# Patient Record
Sex: Female | Born: 1960 | Race: White | Hispanic: No | Smoking: Current every day smoker
Health system: Southern US, Community
[De-identification: ages and names within clinical notes are randomized; demographics above are authoritative.]

## PROBLEM LIST (undated history)

## (undated) DIAGNOSIS — M5030 Other cervical disc degeneration, unspecified cervical region: Secondary | ICD-10-CM

## (undated) DIAGNOSIS — T7840XA Allergy, unspecified, initial encounter: Secondary | ICD-10-CM

## (undated) DIAGNOSIS — K589 Irritable bowel syndrome without diarrhea: Secondary | ICD-10-CM

## (undated) DIAGNOSIS — K219 Gastro-esophageal reflux disease without esophagitis: Secondary | ICD-10-CM

## (undated) DIAGNOSIS — R197 Diarrhea, unspecified: Secondary | ICD-10-CM

## (undated) DIAGNOSIS — K3189 Other diseases of stomach and duodenum: Secondary | ICD-10-CM

## (undated) DIAGNOSIS — M5136 Other intervertebral disc degeneration, lumbar region: Secondary | ICD-10-CM

## (undated) DIAGNOSIS — M51369 Other intervertebral disc degeneration, lumbar region without mention of lumbar back pain or lower extremity pain: Secondary | ICD-10-CM

## (undated) DIAGNOSIS — L409 Psoriasis, unspecified: Secondary | ICD-10-CM

## (undated) DIAGNOSIS — R569 Unspecified convulsions: Secondary | ICD-10-CM

## (undated) DIAGNOSIS — K2970 Gastritis, unspecified, without bleeding: Secondary | ICD-10-CM

## (undated) HISTORY — DX: Unspecified convulsions: R56.9

## (undated) HISTORY — DX: Diarrhea, unspecified: R19.7

## (undated) HISTORY — DX: Gastro-esophageal reflux disease without esophagitis: K21.9

## (undated) HISTORY — PX: COLONOSCOPY: SHX174

## (undated) HISTORY — PX: TONSILLECTOMY: SUR1361

## (undated) HISTORY — DX: Allergy, unspecified, initial encounter: T78.40XA

## (undated) HISTORY — DX: Psoriasis, unspecified: L40.9

## (undated) HISTORY — DX: Other cervical disc degeneration, unspecified cervical region: M50.30

---

## 2005-05-18 ENCOUNTER — Ambulatory Visit (HOSPITAL_COMMUNITY): Admission: RE | Admit: 2005-05-18 | Discharge: 2005-05-18 | Payer: Self-pay | Admitting: Family Medicine

## 2006-05-11 ENCOUNTER — Ambulatory Visit (HOSPITAL_COMMUNITY): Admission: RE | Admit: 2006-05-11 | Discharge: 2006-05-11 | Payer: Self-pay | Admitting: Family Medicine

## 2008-06-27 ENCOUNTER — Ambulatory Visit (HOSPITAL_COMMUNITY): Admission: RE | Admit: 2008-06-27 | Discharge: 2008-06-27 | Payer: Self-pay | Admitting: Family Medicine

## 2009-09-17 ENCOUNTER — Ambulatory Visit (HOSPITAL_COMMUNITY): Admission: RE | Admit: 2009-09-17 | Discharge: 2009-09-17 | Payer: Self-pay | Admitting: Family Medicine

## 2009-09-17 ENCOUNTER — Encounter: Payer: Self-pay | Admitting: Family Medicine

## 2014-04-16 ENCOUNTER — Other Ambulatory Visit: Payer: Self-pay | Admitting: Family Medicine

## 2015-03-07 ENCOUNTER — Other Ambulatory Visit: Payer: Self-pay | Admitting: Family Medicine

## 2015-03-11 ENCOUNTER — Other Ambulatory Visit: Payer: Self-pay | Admitting: Family Medicine

## 2015-03-27 ENCOUNTER — Other Ambulatory Visit: Payer: Self-pay | Admitting: Family Medicine

## 2015-06-21 ENCOUNTER — Ambulatory Visit (INDEPENDENT_AMBULATORY_CARE_PROVIDER_SITE_OTHER): Payer: BC Managed Care – PPO | Admitting: Nurse Practitioner

## 2015-06-21 ENCOUNTER — Encounter: Payer: Self-pay | Admitting: Nurse Practitioner

## 2015-06-21 VITALS — BP 146/96 | Temp 99.0°F | Ht 64.0 in | Wt 174.4 lb

## 2015-06-21 DIAGNOSIS — K2970 Gastritis, unspecified, without bleeding: Secondary | ICD-10-CM | POA: Diagnosis not present

## 2015-06-21 DIAGNOSIS — K2100 Gastro-esophageal reflux disease with esophagitis, without bleeding: Secondary | ICD-10-CM

## 2015-06-21 MED ORDER — BACLOFEN 10 MG PO TABS: 10.0000 mg | ORAL_TABLET | Freq: Three times a day (TID) | ORAL | Status: DC

## 2015-06-21 MED ORDER — PANTOPRAZOLE SODIUM 40 MG PO TBEC: 40.0000 mg | DELAYED_RELEASE_TABLET | Freq: Every day | ORAL | Status: DC

## 2015-06-21 NOTE — Patient Instructions (Addendum)
Align as directed OR 2 cups of activia yogurt  Food Choices for Gastroesophageal Reflux Disease When you have gastroesophageal reflux disease (GERD), the foods you eat and your eating habits are very important. Choosing the right foods can help ease the discomfort of GERD. WHAT GENERAL GUIDELINES DO I NEED TO FOLLOW?  Choose fruits, vegetables, whole grains, low-fat dairy products, and low-fat meat, fish, and poultry.  Limit fats such as oils, salad dressings, butter, nuts, and avocado.  Keep a food diary to identify foods that cause symptoms.  Avoid foods that cause reflux. These may be different for different people.  Eat frequent small meals instead of three large meals each day.  Eat your meals slowly, in a relaxed setting.  Limit fried foods.  Cook foods using methods other than frying.  Avoid drinking alcohol.  Avoid drinking large amounts of liquids with your meals.  Avoid bending over or lying down until 2-3 hours after eating. WHAT FOODS ARE NOT RECOMMENDED? The following are some foods and drinks that may worsen your symptoms: Vegetables Tomatoes. Tomato juice. Tomato and spaghetti sauce. Chili peppers. Onion and garlic. Horseradish. Fruits Oranges, grapefruit, and lemon (fruit and juice). Meats High-fat meats, fish, and poultry. This includes hot dogs, ribs, ham, sausage, salami, and bacon. Dairy Whole milk and chocolate milk. Sour cream. Cream. Butter. Ice cream. Cream cheese.  Beverages Coffee and tea, with or without caffeine. Carbonated beverages or energy drinks. Condiments Hot sauce. Barbecue sauce.  Sweets/Desserts Chocolate and cocoa. Donuts. Peppermint and spearmint. Fats and Oils High-fat foods, including [place] fries and potato chips. Other Vinegar. Strong spices, such as black pepper, white pepper, red pepper, cayenne, curry powder, cloves, ginger, and chili powder. The items listed above may not be a complete list of foods and beverages to avoid.  Contact your dietitian for more information. Document Released: 11/16/2005 Document Revised: 11/21/2013 Document Reviewed: 09/20/2013 [facility] Patient Information 2015 [place], [place]. This information is not intended to replace advice given to you by your health care provider. Make sure you discuss any questions you have with your health care provider.

## 2015-06-25 ENCOUNTER — Encounter: Payer: Self-pay | Admitting: Nurse Practitioner

## 2015-06-25 DIAGNOSIS — K2100 Gastro-esophageal reflux disease with esophagitis, without bleeding: Secondary | ICD-10-CM | POA: Insufficient documentation

## 2015-06-25 DIAGNOSIS — K2970 Gastritis, unspecified, without bleeding: Secondary | ICD-10-CM | POA: Insufficient documentation

## 2015-06-25 NOTE — Progress Notes (Signed)
Subjective:  Presents for complaints of pain in the upper mid abdominal area with bloating at times. Occasional vomiting. Pain will resolve after this. Some of what patient terms "esophageal spasms". No chest pain. See scanned typewritten note that patient provided today for detailed information. Pain is unassociated with meals or particular foods. Minimal caffeine intake. Smokes half pack per day. Denies alcohol use. Some Advil use. Did not take meloxicam due to sensitivity.  Objective:   BP 146/96 mmHg  Temp(Src) 99 F (37.2 C) (Oral)  Ht 5\' 4"  (1.626 m)  Wt 174 lb 6 oz (79.096 kg)  BMI 29.92 kg/m2 NAD. Alert, oriented. Lungs clear. Heart regular rate rhythm. Thoughts coherent and relevant. Has some very definite ideas about chemical sensitivities and chemical intolerance regarding all areas of her life including environmental and foods. Has had at least 2 workups by different specialist for these issues. Abdomen soft nondistended with active bowel sounds 4; moderate epigastric area tenderness, no rebound guarding, no obvious masses.  Assessment: Gastroesophageal reflux disease with esophagitis  Gastritis  Plan: Protonix daily as directed. Call back in 2 weeks if abdominal symptoms persist. Baclofen for cramping and pain. Given copy of diet for reflux disease. Patient to cross reference this with her sensitivities. Return if symptoms worsen or fail to improve.

## 2015-08-31 ENCOUNTER — Encounter: Payer: Self-pay | Admitting: Nurse Practitioner

## 2015-09-02 ENCOUNTER — Other Ambulatory Visit: Payer: Self-pay | Admitting: Nurse Practitioner

## 2015-09-02 ENCOUNTER — Encounter: Payer: Self-pay | Admitting: Nurse Practitioner

## 2015-09-02 MED ORDER — ALPRAZOLAM 0.5 MG PO TBDP: ORAL_TABLET | ORAL | Status: DC

## 2015-09-04 ENCOUNTER — Other Ambulatory Visit: Payer: Self-pay | Admitting: *Deleted

## 2015-10-30 ENCOUNTER — Other Ambulatory Visit: Payer: Self-pay | Admitting: *Deleted

## 2015-10-30 NOTE — Progress Notes (Signed)
1 refill needs office visit 

## 2015-10-31 MED ORDER — ALPRAZOLAM 0.5 MG PO TBDP: ORAL_TABLET | ORAL | Status: DC

## 2015-12-24 ENCOUNTER — Other Ambulatory Visit: Payer: Self-pay | Admitting: Family Medicine

## 2015-12-24 NOTE — Telephone Encounter (Signed)
May have this and 3 refills 

## 2016-02-07 ENCOUNTER — Other Ambulatory Visit: Payer: Self-pay | Admitting: Nurse Practitioner

## 2016-11-05 ENCOUNTER — Inpatient Hospital Stay (HOSPITAL_COMMUNITY): Payer: BC Managed Care – PPO

## 2016-11-05 ENCOUNTER — Emergency Department (HOSPITAL_COMMUNITY): Payer: BC Managed Care – PPO

## 2016-11-05 ENCOUNTER — Inpatient Hospital Stay (HOSPITAL_COMMUNITY)
Admission: EM | Admit: 2016-11-05 | Discharge: 2016-11-07 | DRG: 419 | Disposition: A | Discharge status: Home / Self Care | Payer: BC Managed Care – PPO | Attending: General Surgery | Admitting: General Surgery

## 2016-11-05 ENCOUNTER — Encounter (HOSPITAL_COMMUNITY): Payer: Self-pay | Admitting: *Deleted

## 2016-11-05 DIAGNOSIS — K8000 Calculus of gallbladder with acute cholecystitis without obstruction: Secondary | ICD-10-CM | POA: Diagnosis present

## 2016-11-05 DIAGNOSIS — F17210 Nicotine dependence, cigarettes, uncomplicated: Secondary | ICD-10-CM | POA: Diagnosis present

## 2016-11-05 DIAGNOSIS — K810 Acute cholecystitis: Secondary | ICD-10-CM | POA: Diagnosis not present

## 2016-11-05 DIAGNOSIS — R1013 Epigastric pain: Secondary | ICD-10-CM | POA: Diagnosis present

## 2016-11-05 HISTORY — DX: Gastritis, unspecified, without bleeding: K29.70

## 2016-11-05 HISTORY — DX: Other diseases of stomach and duodenum: K31.89

## 2016-11-05 HISTORY — DX: Irritable bowel syndrome, unspecified: K58.9

## 2016-11-05 HISTORY — DX: Other intervertebral disc degeneration, lumbar region without mention of lumbar back pain or lower extremity pain: M51.369

## 2016-11-05 HISTORY — DX: Gastro-esophageal reflux disease without esophagitis: K21.9

## 2016-11-05 HISTORY — DX: Other intervertebral disc degeneration, lumbar region: M51.36

## 2016-11-05 HISTORY — DX: Other cervical disc degeneration, unspecified cervical region: M50.30

## 2016-11-05 LAB — COMPREHENSIVE METABOLIC PANEL
ALBUMIN: 3.9 g/dL (ref 3.5–5.0)
ALK PHOS: 87 U/L (ref 38–126)
ALT: 20 U/L (ref 14–54)
ANION GAP: 11 (ref 5–15)
AST: 22 U/L (ref 15–41)
BILIRUBIN TOTAL: 0.9 mg/dL (ref 0.3–1.2)
BUN: 8 mg/dL (ref 6–20)
CALCIUM: 8.8 mg/dL — AB (ref 8.9–10.3)
CO2: 22 mmol/L (ref 22–32)
Chloride: 103 mmol/L (ref 101–111)
Creatinine, Ser: 0.6 mg/dL (ref 0.44–1.00)
Glucose, Bld: 83 mg/dL (ref 65–99)
POTASSIUM: 3.4 mmol/L — AB (ref 3.5–5.1)
Sodium: 136 mmol/L (ref 135–145)
TOTAL PROTEIN: 7 g/dL (ref 6.5–8.1)

## 2016-11-05 LAB — URINALYSIS, ROUTINE W REFLEX MICROSCOPIC
BACTERIA UA: NONE SEEN
BILIRUBIN URINE: NEGATIVE
Glucose, UA: NEGATIVE mg/dL
KETONES UR: 80 mg/dL — AB
NITRITE: NEGATIVE
PH: 5 (ref 5.0–8.0)
Protein, ur: NEGATIVE mg/dL
SPECIFIC GRAVITY, URINE: 1.014 (ref 1.005–1.030)

## 2016-11-05 LAB — CBC WITH DIFFERENTIAL/PLATELET
BASOS ABS: 0 10*3/uL (ref 0.0–0.1)
BASOS PCT: 0 %
EOS ABS: 0 10*3/uL (ref 0.0–0.7)
Eosinophils Relative: 0 %
HEMATOCRIT: 44 % (ref 36.0–46.0)
HEMOGLOBIN: 14.8 g/dL (ref 12.0–15.0)
Lymphocytes Relative: 17 %
Lymphs Abs: 1.8 10*3/uL (ref 0.7–4.0)
MCH: 29.4 pg (ref 26.0–34.0)
MCHC: 33.6 g/dL (ref 30.0–36.0)
MCV: 87.3 fL (ref 78.0–100.0)
MONOS PCT: 8 %
Monocytes Absolute: 0.8 10*3/uL (ref 0.1–1.0)
NEUTROS ABS: 8 10*3/uL — AB (ref 1.7–7.7)
NEUTROS PCT: 75 %
Platelets: 209 10*3/uL (ref 150–400)
RBC: 5.04 MIL/uL (ref 3.87–5.11)
RDW: 13.3 % (ref 11.5–15.5)
WBC: 10.7 10*3/uL — ABNORMAL HIGH (ref 4.0–10.5)

## 2016-11-05 LAB — LIPASE, BLOOD: LIPASE: 19 U/L (ref 11–51)

## 2016-11-05 MED ORDER — DICYCLOMINE HCL 10 MG/ML IM SOLN
20.0000 mg | Freq: Once | INTRAMUSCULAR | Status: AC
  Administered 2016-11-05: 20 mg via INTRAMUSCULAR
  Filled 2016-11-05: qty 2

## 2016-11-05 MED ORDER — ONDANSETRON HCL 4 MG PO TABS: 4.0000 mg | ORAL_TABLET | Freq: Four times a day (QID) | ORAL | Status: DC | PRN

## 2016-11-05 MED ORDER — ENOXAPARIN SODIUM 40 MG/0.4ML ~~LOC~~ SOLN
40.0000 mg | SUBCUTANEOUS | Status: DC
  Administered 2016-11-05 – 2016-11-07 (×2): 40 mg via SUBCUTANEOUS
  Filled 2016-11-05 (×2): qty 0.4

## 2016-11-05 MED ORDER — SODIUM CHLORIDE 0.9 % IV BOLUS (SEPSIS)
1000.0000 mL | Freq: Once | INTRAVENOUS | Status: AC
  Administered 2016-11-05: 1000 mL via INTRAVENOUS

## 2016-11-05 MED ORDER — IOPAMIDOL (ISOVUE-300) INJECTION 61%
30.0000 mL | Freq: Once | INTRAVENOUS | Status: AC | PRN
  Administered 2016-11-05: 30 mL via ORAL

## 2016-11-05 MED ORDER — DEXTROSE 5 % IV SOLN
2.0000 g | Freq: Once | INTRAVENOUS | Status: AC
  Administered 2016-11-05: 2 g via INTRAVENOUS
  Filled 2016-11-05: qty 2

## 2016-11-05 MED ORDER — ALPRAZOLAM 0.25 MG PO TABS: 0.2500 mg | ORAL_TABLET | Freq: Two times a day (BID) | ORAL | Status: DC | PRN

## 2016-11-05 MED ORDER — FENTANYL CITRATE (PF) 100 MCG/2ML IJ SOLN
50.0000 ug | Freq: Once | INTRAMUSCULAR | Status: AC
  Administered 2016-11-05: 50 ug via INTRAVENOUS
  Filled 2016-11-05: qty 2

## 2016-11-05 MED ORDER — HYDROMORPHONE HCL 1 MG/ML IJ SOLN
1.0000 mg | INTRAMUSCULAR | Status: DC | PRN
  Administered 2016-11-05 – 2016-11-07 (×14): 1 mg via INTRAVENOUS
  Filled 2016-11-05 (×17): qty 1

## 2016-11-05 MED ORDER — HYDROMORPHONE HCL 1 MG/ML IJ SOLN
1.0000 mg | Freq: Once | INTRAMUSCULAR | Status: AC
  Administered 2016-11-05: 1 mg via INTRAVENOUS
  Filled 2016-11-05: qty 1

## 2016-11-05 MED ORDER — KCL IN DEXTROSE-NACL 40-5-0.45 MEQ/L-%-% IV SOLN
INTRAVENOUS | Status: DC
  Administered 2016-11-05: 10:00:00 via INTRAVENOUS

## 2016-11-05 MED ORDER — HEPARIN SODIUM (PORCINE) 5000 UNIT/ML IJ SOLN: 5000.0000 [IU] | Freq: Three times a day (TID) | INTRAMUSCULAR | Status: DC

## 2016-11-05 MED ORDER — PIPERACILLIN-TAZOBACTAM 3.375 G IVPB
3.3750 g | Freq: Three times a day (TID) | INTRAVENOUS | Status: DC
  Administered 2016-11-05 – 2016-11-07 (×7): 3.375 g via INTRAVENOUS
  Filled 2016-11-05 (×6): qty 50

## 2016-11-05 MED ORDER — DEXTROSE-NACL 5-0.9 % IV SOLN: INTRAVENOUS | Status: DC

## 2016-11-05 MED ORDER — ONDANSETRON HCL 4 MG/2ML IJ SOLN
4.0000 mg | Freq: Once | INTRAMUSCULAR | Status: AC
  Administered 2016-11-05: 4 mg via INTRAVENOUS
  Filled 2016-11-05: qty 2

## 2016-11-05 MED ORDER — ONDANSETRON HCL 4 MG/2ML IJ SOLN
4.0000 mg | Freq: Four times a day (QID) | INTRAMUSCULAR | Status: DC | PRN
  Administered 2016-11-05 – 2016-11-06 (×2): 4 mg via INTRAVENOUS
  Filled 2016-11-05 (×2): qty 2

## 2016-11-05 MED ORDER — FENTANYL CITRATE (PF) 100 MCG/2ML IJ SOLN
25.0000 ug | Freq: Once | INTRAMUSCULAR | Status: AC
  Administered 2016-11-05: 25 ug via INTRAVENOUS
  Filled 2016-11-05: qty 2

## 2016-11-05 MED ORDER — SODIUM CHLORIDE 0.9 % IV BOLUS (SEPSIS)
500.0000 mL | Freq: Once | INTRAVENOUS | Status: AC
  Administered 2016-11-05: 500 mL via INTRAVENOUS

## 2016-11-05 MED ORDER — IOPAMIDOL (ISOVUE-300) INJECTION 61%
100.0000 mL | Freq: Once | INTRAVENOUS | Status: AC | PRN
  Administered 2016-11-05: 100 mL via INTRAVENOUS

## 2016-11-05 NOTE — H&P (Signed)
History and Physical    Ashlee Carpenter [id number] DOB: Jun 18, 1961 DOA: 11/05/2016  PCP: Sallee Lange, MD  Patient coming from: Home.    Chief Complaint:  Abdominal pain and nausea.   HPI: Ashlee Carpenter is an 55 y.o. female with hx of GERD, IBS, intermittent abdominal pain, presented to the ER with epigastric pain.  She has this pain on and off for several months.  She denied black or bloody stool.  No reported of fever or chills.  Evaluation in the ER showed no significant leukocytosis with WBC of 10.7K, and an abd/pelvic CT showed cholelithiasis with evidence of acute cholecystitis, and mildly dilated intra hepatic ducts.  Her lipase and LFTs were perfectly normal.  Consultation with Dr Arnoldo Morale by EDP was requested, and hospitalist was asked to admit her for acute cholecystitis.    ED Course:  See above.  Rewiew of Systems:  Constitutional: Negative for malaise, fever and chills. No significant weight loss or weight gain Eyes: Negative for eye pain, redness and discharge, diplopia, visual changes, or flashes of light. ENMT: Negative for ear pain, hoarseness, nasal congestion, sinus pressure and sore throat. No headaches; tinnitus, drooling, or problem swallowing. Cardiovascular: Negative for chest pain, palpitations, diaphoresis, dyspnea and peripheral edema. ; No orthopnea, PND Respiratory: Negative for cough, hemoptysis, wheezing and stridor. No pleuritic chestpain. Gastrointestinal: Negative for diarrhea, constipation,  melena, blood in stool, hematemesis, jaundice and rectal bleeding.    Genitourinary: Negative for frequency, dysuria, incontinence,flank pain and hematuria; Musculoskeletal: Negative for back pain and neck pain. Negative for swelling and trauma.;  Skin: . Negative for pruritus, rash, abrasions, bruising and skin lesion.; ulcerations Neuro: Negative for headache, lightheadedness and neck stiffness. Negative for weakness, altered level of consciousness , altered  mental status, extremity weakness, burning feet, involuntary movement, seizure and syncope.  Psych: negative for anxiety, depression, insomnia, tearfulness, panic attacks, hallucinations, paranoia, suicidal or homicidal ideation   Past Medical History:  Diagnosis Date  . Degenerative disc disease, cervical   . Degenerative lumbar disc   . Gastritis   . GERD (gastroesophageal reflux disease)   . IBS (irritable bowel syndrome)     Past Surgical History:  Procedure Laterality Date  . TONSILLECTOMY       reports that she has been smoking.  She has been smoking about 0.50 packs per day. She has never used smokeless tobacco. She reports that she does not drink alcohol or use drugs.  Allergies  Allergen Reactions  . Gluten Meal   . Other     Chemicals, fragrances, gas fumes    History reviewed. No pertinent family history.   Prior to Admission medications   Medication Sig Start Date End Date Taking? Authorizing Provider  ALPRAZolam (XANAX) 0.5 MG tablet TAKE 1/2 TO 1 TABLET BY MOUTH TWICE DAILY AS NEEDED FOR ANXIETY 12/24/15  Yes Kathyrn Drown, MD  ibuprofen (ADVIL,MOTRIN) 200 MG tablet Take 200 mg by mouth every 6 (six) hours as needed.    Historical Provider, MD    Physical Exam: Vitals:   11/05/16 0019 11/05/16 0025 11/05/16 0255 11/05/16 0540  BP:  161/74 167/80 159/68  Pulse:  76 62 83  Resp:  18 16 20   Temp:  98.7 F (37.1 C)  98.2 F (36.8 C)  TempSrc:  Oral  Oral  SpO2:  99% 100% 96%  Weight: 78.9 kg (174 lb)     Height: 5\' 5"  (1.651 m)         Constitutional: NAD, calm, comfortable  Vitals:   11/05/16 0019 11/05/16 0025 11/05/16 0255 11/05/16 0540  BP:  161/74 167/80 159/68  Pulse:  76 62 83  Resp:  18 16 20   Temp:  98.7 F (37.1 C)  98.2 F (36.8 C)  TempSrc:  Oral  Oral  SpO2:  99% 100% 96%  Weight: 78.9 kg (174 lb)     Height: 5\' 5"  (1.651 m)      Eyes: PERRL, lids and conjunctivae normal ENMT: Mucous membranes are moist. Posterior pharynx clear  of any exudate or lesions.Normal dentition.  Neck: normal, supple, no masses, no thyromegaly Respiratory: clear to auscultation bilaterally, no wheezing, no crackles. Normal respiratory effort. No accessory muscle use.  Cardiovascular: Regular rate and rhythm, no murmurs / rubs / gallops. No extremity edema. 2+ pedal pulses. No carotid bruits.  Abdomen: tender over the RUQ and epigastric pain, but no rebound. no masses palpated. No hepatosplenomegaly. Bowel sounds positive.  Musculoskeletal: no clubbing / cyanosis. No joint deformity upper and lower extremities. Good ROM, no contractures. Normal muscle tone.  Skin: no rashes, lesions, ulcers. No induration Neurologic: CN 2-12 grossly intact. Sensation intact, DTR normal. Strength 5/5 in all 4.  Psychiatric: Normal judgment and insight. Alert and oriented x 3. Normal mood.    Labs on Admission: I have personally reviewed following labs and imaging studies  CBC:  Recent Labs Lab 11/05/16 0058  WBC 10.7*  NEUTROABS 8.0*  HGB 14.8  HCT 44.0  MCV 87.3  PLT [id number]   Basic Metabolic Panel:  Recent Labs Lab 11/05/16 0138  NA 136  K 3.4*  CL 103  CO2 22  GLUCOSE 83  BUN 8  CREATININE 0.60  CALCIUM 8.8*   GFR: Estimated Creatinine Clearance: 82.5 mL/min (by C-G formula based on SCr of 0.6 mg/dL). Liver Function Tests:  Recent Labs Lab 11/05/16 0138  AST 22  ALT 20  ALKPHOS 87  BILITOT 0.9  PROT 7.0  ALBUMIN 3.9    Recent Labs Lab 11/05/16 0138  LIPASE 19   Urine analysis:    Component Value Date/Time   COLORURINE YELLOW 11/05/2016 0125   APPEARANCEUR CLEAR 11/05/2016 0125   LABSPEC 1.014 11/05/2016 0125   PHURINE 5.0 11/05/2016 0125   GLUCOSEU NEGATIVE 11/05/2016 0125   HGBUR SMALL (A) 11/05/2016 0125   BILIRUBINUR NEGATIVE 11/05/2016 0125   KETONESUR 80 (A) 11/05/2016 0125   PROTEINUR NEGATIVE 11/05/2016 0125   NITRITE NEGATIVE 11/05/2016 0125   LEUKOCYTESUR TRACE (A) 11/05/2016 0125     Radiological  Exams on Admission: Ct Abdomen Pelvis W Contrast  Result Date: 11/05/2016 CLINICAL DATA:  Waxing and waning sharp epigastric pain for 1 week. Pain radiates to back, improving for 3 days, worsening for tonight. Recurring symptoms for 6 years. History of irritable bowel syndrome, gastritis. EXAM: CT ABDOMEN AND PELVIS WITH CONTRAST TECHNIQUE: Multidetector CT imaging of the abdomen and pelvis was performed using the standard protocol following bolus administration of intravenous contrast. CONTRAST:  11mL ISOVUE-300 IOPAMIDOL (ISOVUE-300) INJECTION 61%, 129mL ISOVUE-300 IOPAMIDOL (ISOVUE-300) INJECTION 61% COMPARISON:  None. FINDINGS: LOWER CHEST: Lung bases are clear. Included heart size is normal. No pericardial effusion. HEPATOBILIARY: Gallbladder is mildly distended and dense. 19 mm gallstone at the neck. Mild pericholecystic fat stranding. Tiny layering stones within the gallbladder fundus. 19 mm hypodensity in early phase, filling in on delayed phase compatible with hemangioma. Minimal intrahepatic biliary dilatation. PANCREAS: Normal. SPLEEN: Normal. ADRENALS/URINARY TRACT: Kidneys are orthotopic, demonstrating symmetric enhancement. No nephrolithiasis, hydronephrosis or solid renal masses. The unopacified ureters  are normal in course and caliber. Delayed imaging through the kidneys demonstrates symmetric prompt contrast excretion within the proximal urinary collecting system. Urinary bladder is partially distended and unremarkable. Normal adrenal glands. STOMACH/BOWEL: The stomach, small and large bowel are normal in course and caliber without inflammatory changes, mild calcific atherosclerosis. RIGHT: Intramural fat. Normal appendix. VASCULAR/LYMPHATIC: Aortoiliac vessels are normal in course and caliber, mild calcific atherosclerosis. No lymphadenopathy by CT size criteria. REPRODUCTIVE: Normal. OTHER: No intraperitoneal free fluid or free air. MUSCULOSKELETAL: Nonacute. Focal LEFT flank subcutaneous gas  and fat stranding suggests injection site. IMPRESSION: Cholelithiasis and CT findings of acute cholecystitis. RIGHT colon intramural fat compatible with chronic inflammation without acute component. Electronically Signed   By: Elon Alas M.D.   On: 11/05/2016 04:15    EKG: Independently reviewed.   Assessment/Plan Principal Problem:   Acute cholecystitis    PLAN:   Acute cholecystitis with non obstructive cholelithiasis:  Will change IV Rocephin to IV Zosyn.  Continue with NPO and IVF.  Will give IV analgesics.  Await further recommendation from surgical consultation (likely need CCY).  She is stable.    DVT prophylaxis:  SQ Heparin.  Code Status: FULL CODE.   Family Communication:  Mother at bedside.  Disposition Plan: to home when appropriate.  Consults called: None. Admission status: Inpatient.    Marquis Diles MD FACP. [facility]  If 7PM-7AM, please contact night-coverage [email or web address] Password [facility]  11/05/2016, 5:44 AM

## 2016-11-05 NOTE — Consult Note (Signed)
Reason for Consult: Acute cholecystitis, cholelithiasis Referring Physician: Dr. [name] is an 55 y.o. female.  HPI: Patient is a 55 year old white female who presented to [facility] with worsening right upper quadrant abdominal pain, nausea, vomiting. She states she has had these episodes intermittently over the past 6 years. She denies any fever, chills, or jaundice. She states that various foods bring on intact, though they can occur spontaneously. She presented to the emergency room for evaluation. CT scan the abdomen revealed acute cholecystitis with cholelithiasis. She was admitted to the hospital for further evaluation and treatment. This morning, she states her abdominal pain has resolved. She does have some nausea, but is thirsty.  Past Medical History:  Diagnosis Date  . Degenerative disc disease, cervical   . Degenerative lumbar disc   . Gastritis   . GERD (gastroesophageal reflux disease)   . IBS (irritable bowel syndrome)     Past Surgical History:  Procedure Laterality Date  . TONSILLECTOMY      History reviewed. No pertinent family history.  Social History:  reports that she has been smoking.  She has been smoking about 0.50 packs per day. She has never used smokeless tobacco. She reports that she does not drink alcohol or use drugs.  Allergies:  Allergies  Allergen Reactions  . Gluten Meal   . Other     Chemicals, fragrances, gas fumes    Medications:  Prior to Admission:  Prescriptions Prior to Admission  Medication Sig Dispense Refill Last Dose  . ALPRAZolam (XANAX) 0.5 MG tablet TAKE 1/2 TO 1 TABLET BY MOUTH TWICE DAILY AS NEEDED FOR ANXIETY 30 tablet 3   . ibuprofen (ADVIL,MOTRIN) 200 MG tablet Take 200 mg by mouth every 6 (six) hours as needed.   Taking   Scheduled: . enoxaparin (LOVENOX) injection  40 mg Subcutaneous Q24H  . piperacillin-tazobactam  3.375 g Intravenous Q8H    Results for orders placed or performed during the  hospital encounter of 11/05/16 (from the past 48 hour(s))  CBC with Differential     Status: Abnormal   Collection Time: 11/05/16 12:58 AM  Result Value Ref Range   WBC 10.7 (H) 4.0 - 10.5 K/uL   RBC 5.04 3.87 - 5.11 MIL/uL   Hemoglobin 14.8 12.0 - 15.0 g/dL   HCT 44.0 36.0 - 46.0 %   MCV 87.3 78.0 - 100.0 fL   MCH 29.4 26.0 - 34.0 pg   MCHC 33.6 30.0 - 36.0 g/dL   RDW 13.3 11.5 - 15.5 %   Platelets 209 150 - 400 K/uL   Neutrophils Relative % 75 %   Neutro Abs 8.0 (H) 1.7 - 7.7 K/uL   Lymphocytes Relative 17 %   Lymphs Abs 1.8 0.7 - 4.0 K/uL   Monocytes Relative 8 %   Monocytes Absolute 0.8 0.1 - 1.0 K/uL   Eosinophils Relative 0 %   Eosinophils Absolute 0.0 0.0 - 0.7 K/uL   Basophils Relative 0 %   Basophils Absolute 0.0 0.0 - 0.1 K/uL  Urinalysis, Routine w reflex microscopic     Status: Abnormal   Collection Time: 11/05/16  1:25 AM  Result Value Ref Range   Color, Urine YELLOW YELLOW   APPearance CLEAR CLEAR   Specific Gravity, Urine 1.014 1.005 - 1.030   pH 5.0 5.0 - 8.0   Glucose, UA NEGATIVE NEGATIVE mg/dL   Hgb urine dipstick SMALL (A) NEGATIVE   Bilirubin Urine NEGATIVE NEGATIVE   Ketones, ur 80 (A) NEGATIVE  mg/dL   Protein, ur NEGATIVE NEGATIVE mg/dL   Nitrite NEGATIVE NEGATIVE   Leukocytes, UA TRACE (A) NEGATIVE   RBC / HPF 0-5 0 - 5 RBC/hpf   WBC, UA 0-5 0 - 5 WBC/hpf   Bacteria, UA NONE SEEN NONE SEEN   Mucous PRESENT   Comprehensive metabolic panel     Status: Abnormal   Collection Time: 11/05/16  1:38 AM  Result Value Ref Range   Sodium 136 135 - 145 mmol/L   Potassium 3.4 (L) 3.5 - 5.1 mmol/L   Chloride 103 101 - 111 mmol/L   CO2 22 22 - 32 mmol/L   Glucose, Bld 83 65 - 99 mg/dL   BUN 8 6 - 20 mg/dL   Creatinine, Ser 0.60 0.44 - 1.00 mg/dL   Calcium 8.8 (L) 8.9 - 10.3 mg/dL   Total Protein 7.0 6.5 - 8.1 g/dL   Albumin 3.9 3.5 - 5.0 g/dL   AST 22 15 - 41 U/L   ALT 20 14 - 54 U/L   Alkaline Phosphatase 87 38 - 126 U/L   Total Bilirubin 0.9 0.3 -  1.2 mg/dL   GFR calc non Af Amer >60 >60 mL/min   GFR calc Af Amer >60 >60 mL/min    Comment: (NOTE) The eGFR has been calculated using the CKD EPI equation. This calculation has not been validated in all clinical situations. eGFR's persistently <60 mL/min signify possible Chronic Kidney Disease.    Anion gap 11 5 - 15  Lipase, blood     Status: None   Collection Time: 11/05/16  1:38 AM  Result Value Ref Range   Lipase 19 11 - 51 U/L    Ct Abdomen Pelvis W Contrast  Result Date: 11/05/2016 CLINICAL DATA:  Waxing and waning sharp epigastric pain for 1 week. Pain radiates to back, improving for 3 days, worsening for tonight. Recurring symptoms for 6 years. History of irritable bowel syndrome, gastritis. EXAM: CT ABDOMEN AND PELVIS WITH CONTRAST TECHNIQUE: Multidetector CT imaging of the abdomen and pelvis was performed using the standard protocol following bolus administration of intravenous contrast. CONTRAST:  24m ISOVUE-300 IOPAMIDOL (ISOVUE-300) INJECTION 61%, 1074mISOVUE-300 IOPAMIDOL (ISOVUE-300) INJECTION 61% COMPARISON:  None. FINDINGS: LOWER CHEST: Lung bases are clear. Included heart size is normal. No pericardial effusion. HEPATOBILIARY: Gallbladder is mildly distended and dense. 19 mm gallstone at the neck. Mild pericholecystic fat stranding. Tiny layering stones within the gallbladder fundus. 19 mm hypodensity in early phase, filling in on delayed phase compatible with hemangioma. Minimal intrahepatic biliary dilatation. PANCREAS: Normal. SPLEEN: Normal. ADRENALS/URINARY TRACT: Kidneys are orthotopic, demonstrating symmetric enhancement. No nephrolithiasis, hydronephrosis or solid renal masses. The unopacified ureters are normal in course and caliber. Delayed imaging through the kidneys demonstrates symmetric prompt contrast excretion within the proximal urinary collecting system. Urinary bladder is partially distended and unremarkable. Normal adrenal glands. STOMACH/BOWEL: The  stomach, small and large bowel are normal in course and caliber without inflammatory changes, mild calcific atherosclerosis. RIGHT: Intramural fat. Normal appendix. VASCULAR/LYMPHATIC: Aortoiliac vessels are normal in course and caliber, mild calcific atherosclerosis. No lymphadenopathy by CT size criteria. REPRODUCTIVE: Normal. OTHER: No intraperitoneal free fluid or free air. MUSCULOSKELETAL: Nonacute. Focal LEFT flank subcutaneous gas and fat stranding suggests injection site. IMPRESSION: Cholelithiasis and CT findings of acute cholecystitis. RIGHT colon intramural fat compatible with chronic inflammation without acute component. Electronically Signed   By: CoElon Alas.D.   On: 11/05/2016 04:15    ROS:  Pertinent items noted in HPI and remainder  of comprehensive ROS otherwise negative.  Blood pressure 159/68, pulse 83, temperature 98.2 F (36.8 C), temperature source Oral, resp. rate 20, height 5' 5"  (1.651 m), weight 78.9 kg (174 lb), SpO2 96 %. Physical Exam: Well-developed well-nourished white female in no acute distress. Head is normocephalic, atraumatic. No scleral icterus is noted on eye exam. Neck is supple without lymphadenopathy or carotid bruits. Lungs clear to auscultation with equal breath sounds bilaterally. Heart examination reveals a regular rate and rhythm without S3, S4, murmurs. Abdomen is soft, nontender, nondistended. No hepatosplenomegaly, masses, hernias identified. No rigidity is noted. History and physical exam note reviewed, CT scan images reviewed.  Assessment/Plan: Impression: Acute cholecystitis, cholelithiasis Plan: We will continue IV hydration. We'll proceed with laparoscopic cholecystectomy on 11/06/2016. The risks and benefits of the procedure including bleeding, infection, hepatobiliary injury, and the possibility of an open procedure were fully explained to the patient, who gave informed consent.  Ashlee Carpenter A 11/05/2016, 9:00 AM

## 2016-11-05 NOTE — Care Management Note (Signed)
Case Management Note  Patient Details  Name: Ashlee Carpenter MRN: [id number] Date of Birth: 1961-04-02  Subjective/Objective:                  Pt admitted with cholecystitis. Chart reviewed for CM needs. Pt is from home, ind with ADL's. Pt has PCP, transportation and insurance with drug coverage.   Action/Plan: Plan for return home with self care.  No CM needs anticipated.   Expected Discharge Date:     11/07/2016             Expected Discharge Plan:  Home/Self Care  In-House Referral:  NA  Discharge planning Services  CM Consult  Post Acute Care Choice:  NA Choice offered to:  NA  Status of Service:  Completed, signed off  Sherald Barge, RN 11/05/2016, 3:00 PM

## 2016-11-05 NOTE — ED Triage Notes (Signed)
Pt c/o severe abdominal pain with nausea and diarrhea

## 2016-11-05 NOTE — ED Provider Notes (Signed)
[name] DEPT Provider Note   CSN: [id number] Arrival date & time: 11/05/16  0009  By signing my name below, I, Ashlee Carpenter, attest that this documentation has been prepared under the direction and in the presence of Rolland Porter, MD. Electronically Signed: Reola Carpenter, ED Scribe. 11/05/16. 12:38 AM.  Time seen 12:21 AM  History   Chief Complaint Chief Complaint  Patient presents with  . Abdominal Pain   The history is provided by the patient. No language interpreter was used.    HPI Comments: Ashlee Carpenter is a 55 y.o. female with a PMHx of GERD, Gastritis, and IBS, who presents to the Emergency Department complaining of waxing and waning, epigastric abdominal pain onset approximately one week ago. Pt describes her pain as sharp, dull, aching, and tightness. She notes that her pain radiates around her abdomen and into her back. Pt also notes that her pain had improved approximately 3 days ago, but began to worsen again two nights ago. She reports associated chills, subjective fever, small amounts of diarrhea, and nausea secondary to her current abdominal pain. Pt notes that she has a h/o similar pain/symptoms over the past six years, for which she has been seen by her PCP. At that time she was dx'd with GERD, for which she was rx'd anti-acid medications which she tried taking without relief. She is not currently followed by a gastroenterologist. Pt also reports that she has been going to an acupuncturist, who she last saw two days ago, who has managed to alleviate her pain in the past. No exacerbating factors noted. She states that laying flat on her abdomen will sometimes alleviate her pain. She denies vomiting, or any other associated symptoms. Patient also describes a lot of sensitivities to chemicals and mean fireman. She has a spray bottle with her that she uses to spray her nose to get to chemicals out of her nose. Patient has a notebook full of writing that she  refers to. Pt states she is on a gluten free diet.   PCP: Sallee Lange, MD  Past Medical History:  Diagnosis Date  . Degenerative disc disease, cervical   . Degenerative lumbar disc   . Gastritis   . GERD (gastroesophageal reflux disease)   . IBS (irritable bowel syndrome)     Patient Active Problem List   Diagnosis Date Noted  . Gastroesophageal reflux disease with esophagitis 06/25/2015  . Gastritis 06/25/2015   Past Surgical History:  Procedure Laterality Date  . TONSILLECTOMY      OB History    No data available     Home Medications    Prior to Admission medications   Medication Sig Start Date End Date Taking? Authorizing Provider  ALPRAZolam (XANAX) 0.5 MG tablet TAKE 1/2 TO 1 TABLET BY MOUTH TWICE DAILY AS NEEDED FOR ANXIETY 12/24/15  Yes Kathyrn Drown, MD  ibuprofen (ADVIL,MOTRIN) 200 MG tablet Take 200 mg by mouth every 6 (six) hours as needed.    Historical Provider, MD   Family History History reviewed. No pertinent family history.  Social History Social History  Substance Use Topics  . Smoking status: Current Every Day Smoker    Packs/day: 0.50  . Smokeless tobacco: Never Used  . Alcohol use No   Allergies   Gluten meal and Other  Review of Systems Review of Systems  Constitutional: Positive for chills and fever (subjective).  Gastrointestinal: Positive for abdominal pain and nausea. Negative for vomiting.  All other systems reviewed and are  negative.  Physical Exam Updated Vital Signs BP 161/74 (BP Location: Left Arm)   Pulse 76   Temp 98.7 F (37.1 C) (Oral)   Resp 18   Ht 5\' 5"  (1.651 m)   Wt 174 lb (78.9 kg)   SpO2 99%   BMI 28.96 kg/m   Vital signs normal    Physical Exam  Constitutional: She is oriented to person, place, and time. She appears well-developed and well-nourished.  Non-toxic appearance. She does not appear ill. No distress.  HENT:  Head: Normocephalic and atraumatic.  Right Ear: External ear normal.  Left Ear:  External ear normal.  Nose: Nose normal. No mucosal edema or rhinorrhea.  Mouth/Throat: Oropharynx is clear and moist and mucous membranes are normal. No dental abscesses or uvula swelling.  Eyes: Conjunctivae and EOM are normal. Pupils are equal, round, and reactive to light.  Neck: Normal range of motion and full passive range of motion without pain. Neck supple.  Cardiovascular: Normal rate, regular rhythm and normal heart sounds.  Exam reveals no gallop and no friction rub.   No murmur heard. Pulmonary/Chest: Effort normal and breath sounds normal. No respiratory distress. She has no wheezes. She has no rhonchi. She has no rales. She exhibits no tenderness and no crepitus.  Abdominal: Soft. Normal appearance and bowel sounds are normal. She exhibits no distension. There is tenderness. There is no rebound and no guarding.    Diffuse upper abdominal tenderness  Musculoskeletal: Normal range of motion. She exhibits no edema or tenderness.  Moves all extremities well.   Neurological: She is alert and oriented to person, place, and time. She has normal strength. No cranial nerve deficit.  Skin: Skin is warm, dry and intact. No rash noted. No erythema. No pallor.  . Large patch of eczema or psoriasis to the area inferior to her umbilicus.   Psychiatric: She has a normal mood and affect. Her speech is normal and behavior is normal. Her mood appears not anxious.  Nursing note and vitals reviewed.  ED Treatments / Results  DIAGNOSTIC STUDIES: Oxygen Saturation is 99% on RA, normal by my interpretation.   Labs (all labs ordered are listed, but only abnormal results are displayed) Results for orders placed or performed during the hospital encounter of 11/05/16  Comprehensive metabolic panel  Result Value Ref Range   Sodium 136 135 - 145 mmol/L   Potassium 3.4 (L) 3.5 - 5.1 mmol/L   Chloride 103 101 - 111 mmol/L   CO2 22 22 - 32 mmol/L   Glucose, Bld 83 65 - 99 mg/dL   BUN 8 6 - 20 mg/dL     Creatinine, Ser 0.60 0.44 - 1.00 mg/dL   Calcium 8.8 (L) 8.9 - 10.3 mg/dL   Total Protein 7.0 6.5 - 8.1 g/dL   Albumin 3.9 3.5 - 5.0 g/dL   AST 22 15 - 41 U/L   ALT 20 14 - 54 U/L   Alkaline Phosphatase 87 38 - 126 U/L   Total Bilirubin 0.9 0.3 - 1.2 mg/dL   GFR calc non Af Amer >60 >60 mL/min   GFR calc Af Amer >60 >60 mL/min   Anion gap 11 5 - 15  Lipase, blood  Result Value Ref Range   Lipase 19 11 - 51 U/L  CBC with Differential  Result Value Ref Range   WBC 10.7 (H) 4.0 - 10.5 K/uL   RBC 5.04 3.87 - 5.11 MIL/uL   Hemoglobin 14.8 12.0 - 15.0 g/dL   HCT  44.0 36.0 - 46.0 %   MCV 87.3 78.0 - 100.0 fL   MCH 29.4 26.0 - 34.0 pg   MCHC 33.6 30.0 - 36.0 g/dL   RDW 13.3 11.5 - 15.5 %   Platelets 209 150 - 400 K/uL   Neutrophils Relative % 75 %   Neutro Abs 8.0 (H) 1.7 - 7.7 K/uL   Lymphocytes Relative 17 %   Lymphs Abs 1.8 0.7 - 4.0 K/uL   Monocytes Relative 8 %   Monocytes Absolute 0.8 0.1 - 1.0 K/uL   Eosinophils Relative 0 %   Eosinophils Absolute 0.0 0.0 - 0.7 K/uL   Basophils Relative 0 %   Basophils Absolute 0.0 0.0 - 0.1 K/uL  Urinalysis, Routine w reflex microscopic  Result Value Ref Range   Color, Urine YELLOW YELLOW   APPearance CLEAR CLEAR   Specific Gravity, Urine 1.014 1.005 - 1.030   pH 5.0 5.0 - 8.0   Glucose, UA NEGATIVE NEGATIVE mg/dL   Hgb urine dipstick SMALL (A) NEGATIVE   Bilirubin Urine NEGATIVE NEGATIVE   Ketones, ur 80 (A) NEGATIVE mg/dL   Protein, ur NEGATIVE NEGATIVE mg/dL   Nitrite NEGATIVE NEGATIVE   Leukocytes, UA TRACE (A) NEGATIVE   RBC / HPF 0-5 0 - 5 RBC/hpf   WBC, UA 0-5 0 - 5 WBC/hpf   Bacteria, UA NONE SEEN NONE SEEN   Mucous PRESENT     Laboratory interpretation all normal except mild leukocytosis,mild hypokalemia     EKG  EKG Interpretation None      Radiology Ct Abdomen Pelvis W Contrast  Result Date: 11/05/2016 CLINICAL DATA:  Waxing and waning sharp epigastric pain for 1 week. Pain radiates to back, improving  for 3 days, worsening for tonight. Recurring symptoms for 6 years. History of irritable bowel syndrome, gastritis. EXAM: CT ABDOMEN AND PELVIS WITH CONTRAST TECHNIQUE: Multidetector CT imaging of the abdomen and pelvis was performed using the standard protocol following bolus administration of intravenous contrast. CONTRAST:  69mL ISOVUE-300 IOPAMIDOL (ISOVUE-300) INJECTION 61%, 124mL ISOVUE-300 IOPAMIDOL (ISOVUE-300) INJECTION 61% COMPARISON:  None. FINDINGS: LOWER CHEST: Lung bases are clear. Included heart size is normal. No pericardial effusion. HEPATOBILIARY: Gallbladder is mildly distended and dense. 19 mm gallstone at the neck. Mild pericholecystic fat stranding. Tiny layering stones within the gallbladder fundus. 19 mm hypodensity in early phase, filling in on delayed phase compatible with hemangioma. Minimal intrahepatic biliary dilatation. PANCREAS: Normal. SPLEEN: Normal. ADRENALS/URINARY TRACT: Kidneys are orthotopic, demonstrating symmetric enhancement. No nephrolithiasis, hydronephrosis or solid renal masses. The unopacified ureters are normal in course and caliber. Delayed imaging through the kidneys demonstrates symmetric prompt contrast excretion within the proximal urinary collecting system. Urinary bladder is partially distended and unremarkable. Normal adrenal glands. STOMACH/BOWEL: The stomach, small and large bowel are normal in course and caliber without inflammatory changes, mild calcific atherosclerosis. RIGHT: Intramural fat. Normal appendix. VASCULAR/LYMPHATIC: Aortoiliac vessels are normal in course and caliber, mild calcific atherosclerosis. No lymphadenopathy by CT size criteria. REPRODUCTIVE: Normal. OTHER: No intraperitoneal free fluid or free air. MUSCULOSKELETAL: Nonacute. Focal LEFT flank subcutaneous gas and fat stranding suggests injection site. IMPRESSION: Cholelithiasis and CT findings of acute cholecystitis. RIGHT colon intramural fat compatible with chronic inflammation  without acute component. Electronically Signed   By: Elon Alas M.D.   On: 11/05/2016 04:15    Procedures Procedures   Medications Ordered in ED Medications  cefTRIAXone (ROCEPHIN) 2 g in dextrose 5 % 50 mL IVPB (2 g Intravenous New Bag/Given 11/05/16 0444)  sodium chloride 0.9 %  bolus 1,000 mL (0 mLs Intravenous Stopped 11/05/16 0219)  sodium chloride 0.9 % bolus 500 mL (0 mLs Intravenous Stopped 11/05/16 0255)  ondansetron (ZOFRAN) injection 4 mg (4 mg Intravenous Given 11/05/16 0111)  dicyclomine (BENTYL) injection 20 mg (20 mg Intramuscular Given 11/05/16 0111)  sodium chloride 0.9 % bolus 1,000 mL (1,000 mLs Intravenous New Bag/Given 11/05/16 0254)  fentaNYL (SUBLIMAZE) injection 50 mcg (50 mcg Intravenous Given 11/05/16 0155)  fentaNYL (SUBLIMAZE) injection 25 mcg (25 mcg Intravenous Given 11/05/16 0328)  iopamidol (ISOVUE-300) 61 % injection 30 mL (30 mLs Oral Contrast Given 11/05/16 0347)  iopamidol (ISOVUE-300) 61 % injection 100 mL (100 mLs Intravenous Contrast Given 11/05/16 0347)  HYDROmorphone (DILAUDID) injection 1 mg (1 mg Intravenous Given 11/05/16 0451)    Initial Impression / Assessment and Plan / ED Course  I have reviewed the triage vital signs and the nursing notes.  Pertinent labs & imaging results that were available during my care of the patient were reviewed by me and considered in my medical decision making (see chart for details).  Clinical Course    COORDINATION OF CARE: 12:35 AM-Discussed next steps with pt. Pt verbalized understanding and is agreeable with the plan.  Patient was given IV fluids IV pain medication. Laboratory testing was ordered.  Recheck at 02:50 AM Discussed her test results. Patient states her pain is better but still present. We discussed doing APCT scan and she is agreeable. She also describes when she gets the pain she gets a lot of abdominal bloating.  4:30 AM I discussed patient CT results with patient and her mother. She is agreeable  for admission. She was started on IV antibiotics, IV Rocephin which is recommended for low risk acute cholecystitis.  4:46 AM Dr. Arnoldo Morale, surgery states that have hospitalist admit patient and he will see in the morning.  05:07 AM Dr Marin Comment, Hospitalist, will see patient  Final Clinical Impressions(s) / ED Diagnoses   Final diagnoses:  Acute cholecystitis   Plan admission  Rolland Porter, MD, FACEP   I personally performed the services described in this documentation, which was scribed in my presence. The recorded information has been reviewed and considered.  Rolland Porter, MD, Barbette Or, MD 11/05/16 [phone]

## 2016-11-06 ENCOUNTER — Inpatient Hospital Stay (HOSPITAL_COMMUNITY): Payer: BC Managed Care – PPO | Admitting: Anesthesiology

## 2016-11-06 ENCOUNTER — Encounter (HOSPITAL_COMMUNITY)
Admission: EM | Disposition: A | Discharge status: Home / Self Care | Payer: Self-pay | Source: Home / Self Care | Attending: General Surgery

## 2016-11-06 ENCOUNTER — Encounter (HOSPITAL_COMMUNITY): Payer: Self-pay

## 2016-11-06 HISTORY — PX: CHOLECYSTECTOMY: SHX55

## 2016-11-06 LAB — SURGICAL PCR SCREEN
MRSA, PCR: INVALID — AB
Staphylococcus aureus: INVALID — AB

## 2016-11-06 SURGERY — LAPAROSCOPIC CHOLECYSTECTOMY
Anesthesia: General | Site: Abdomen

## 2016-11-06 MED ORDER — LACTATED RINGERS IV SOLN
INTRAVENOUS | Status: DC
  Administered 2016-11-06 (×2): via INTRAVENOUS

## 2016-11-06 MED ORDER — ROCURONIUM BROMIDE 100 MG/10ML IV SOLN
INTRAVENOUS | Status: DC | PRN
  Administered 2016-11-06: 35 mg via INTRAVENOUS
  Administered 2016-11-06: 5 mg via INTRAVENOUS

## 2016-11-06 MED ORDER — FENTANYL CITRATE (PF) 100 MCG/2ML IJ SOLN
INTRAMUSCULAR | Status: AC
  Filled 2016-11-06: qty 2

## 2016-11-06 MED ORDER — DIPHENHYDRAMINE HCL 25 MG PO CAPS: 25.0000 mg | ORAL_CAPSULE | Freq: Four times a day (QID) | ORAL | Status: DC | PRN

## 2016-11-06 MED ORDER — DEXAMETHASONE SODIUM PHOSPHATE 4 MG/ML IJ SOLN
4.0000 mg | Freq: Once | INTRAMUSCULAR | Status: AC
  Administered 2016-11-06: 4 mg via INTRAVENOUS
  Filled 2016-11-06: qty 1

## 2016-11-06 MED ORDER — LIDOCAINE HCL (CARDIAC) 20 MG/ML IV SOLN
INTRAVENOUS | Status: DC | PRN
  Administered 2016-11-06: 50 mg via INTRAVENOUS

## 2016-11-06 MED ORDER — MIDAZOLAM HCL 2 MG/2ML IJ SOLN
1.0000 mg | INTRAMUSCULAR | Status: DC | PRN
  Administered 2016-11-06: 2 mg via INTRAVENOUS
  Filled 2016-11-06: qty 2

## 2016-11-06 MED ORDER — FENTANYL CITRATE (PF) 250 MCG/5ML IJ SOLN
INTRAMUSCULAR | Status: AC
  Filled 2016-11-06: qty 5

## 2016-11-06 MED ORDER — CHLORHEXIDINE GLUCONATE CLOTH 2 % EX PADS
6.0000 | MEDICATED_PAD | Freq: Once | CUTANEOUS | Status: AC
  Administered 2016-11-06: 6 via TOPICAL

## 2016-11-06 MED ORDER — POVIDONE-IODINE 10 % OINT PACKET
TOPICAL_OINTMENT | CUTANEOUS | Status: DC | PRN
  Administered 2016-11-06: 1 via TOPICAL

## 2016-11-06 MED ORDER — SIMETHICONE 80 MG PO CHEW: 40.0000 mg | CHEWABLE_TABLET | Freq: Four times a day (QID) | ORAL | Status: DC | PRN

## 2016-11-06 MED ORDER — PROPOFOL 10 MG/ML IV BOLUS
INTRAVENOUS | Status: AC
  Filled 2016-11-06: qty 20

## 2016-11-06 MED ORDER — FENTANYL CITRATE (PF) 100 MCG/2ML IJ SOLN
INTRAMUSCULAR | Status: DC | PRN
  Administered 2016-11-06: 50 ug via INTRAVENOUS
  Administered 2016-11-06: 100 ug via INTRAVENOUS
  Administered 2016-11-06 (×4): 50 ug via INTRAVENOUS

## 2016-11-06 MED ORDER — SODIUM CHLORIDE 0.9 % IR SOLN
Status: DC | PRN
  Administered 2016-11-06: 1000 mL

## 2016-11-06 MED ORDER — HEMOSTATIC AGENTS (NO CHARGE) OPTIME
TOPICAL | Status: DC | PRN
  Administered 2016-11-06: 1 via TOPICAL

## 2016-11-06 MED ORDER — DIPHENHYDRAMINE HCL 50 MG/ML IJ SOLN: 25.0000 mg | Freq: Four times a day (QID) | INTRAMUSCULAR | Status: DC | PRN

## 2016-11-06 MED ORDER — POVIDONE-IODINE 10 % EX OINT
TOPICAL_OINTMENT | CUTANEOUS | Status: AC
  Filled 2016-11-06: qty 1

## 2016-11-06 MED ORDER — GLYCOPYRROLATE 0.2 MG/ML IJ SOLN
0.2000 mg | Freq: Once | INTRAMUSCULAR | Status: AC
  Administered 2016-11-06: 0.2 mg via INTRAVENOUS
  Filled 2016-11-06: qty 1

## 2016-11-06 MED ORDER — BUPIVACAINE HCL (PF) 0.5 % IJ SOLN
INTRAMUSCULAR | Status: DC | PRN
  Administered 2016-11-06: 10 mL

## 2016-11-06 MED ORDER — KETOROLAC TROMETHAMINE 30 MG/ML IJ SOLN
30.0000 mg | Freq: Once | INTRAMUSCULAR | Status: AC
  Administered 2016-11-06: 30 mg via INTRAVENOUS
  Filled 2016-11-06: qty 1

## 2016-11-06 MED ORDER — NEOSTIGMINE METHYLSULFATE 10 MG/10ML IV SOLN
INTRAVENOUS | Status: DC | PRN
  Administered 2016-11-06: 3 mg via INTRAVENOUS

## 2016-11-06 MED ORDER — LORAZEPAM 2 MG/ML IJ SOLN: 1.0000 mg | INTRAMUSCULAR | Status: DC | PRN

## 2016-11-06 MED ORDER — BUPIVACAINE HCL (PF) 0.5 % IJ SOLN
INTRAMUSCULAR | Status: AC
  Filled 2016-11-06: qty 30

## 2016-11-06 MED ORDER — LACTATED RINGERS IV SOLN
INTRAVENOUS | Status: DC
  Administered 2016-11-06 – 2016-11-07 (×2): via INTRAVENOUS

## 2016-11-06 MED ORDER — PROPOFOL 10 MG/ML IV BOLUS
INTRAVENOUS | Status: DC | PRN
  Administered 2016-11-06: 50 mg via INTRAVENOUS
  Administered 2016-11-06: 20 mg via INTRAVENOUS
  Administered 2016-11-06: 130 mg via INTRAVENOUS

## 2016-11-06 MED ORDER — ONDANSETRON HCL 4 MG/2ML IJ SOLN
4.0000 mg | Freq: Once | INTRAMUSCULAR | Status: AC
  Administered 2016-11-06: 4 mg via INTRAVENOUS
  Filled 2016-11-06: qty 2

## 2016-11-06 MED ORDER — ACETAMINOPHEN 325 MG PO TABS: 650.0000 mg | ORAL_TABLET | Freq: Four times a day (QID) | ORAL | Status: DC | PRN

## 2016-11-06 MED ORDER — OXYCODONE-ACETAMINOPHEN 5-325 MG PO TABS: 1.0000 | ORAL_TABLET | ORAL | Status: DC | PRN

## 2016-11-06 MED ORDER — GLYCOPYRROLATE 0.2 MG/ML IJ SOLN
INTRAMUSCULAR | Status: DC | PRN
  Administered 2016-11-06: 0.4 mg via INTRAVENOUS

## 2016-11-06 MED ORDER — HYDROMORPHONE HCL 1 MG/ML IJ SOLN: 0.2500 mg | INTRAMUSCULAR | Status: DC | PRN

## 2016-11-06 MED ORDER — ACETAMINOPHEN 650 MG RE SUPP: 650.0000 mg | Freq: Four times a day (QID) | RECTAL | Status: DC | PRN

## 2016-11-06 SURGICAL SUPPLY — 51 items
APL SRG 38 LTWT LNG FL B (MISCELLANEOUS) ×1
APPLICATOR ARISTA FLEXITIP XL (MISCELLANEOUS) ×2 IMPLANT
APPLIER CLIP LAPSCP 10X32 DD (CLIP) ×3 IMPLANT
BAG HAMPER (MISCELLANEOUS) ×3 IMPLANT
BAG SPEC RTRVL LRG 6X4 10 (ENDOMECHANICALS) ×1
CHLORAPREP W/TINT 26ML (MISCELLANEOUS) ×3 IMPLANT
CLOTH BEACON ORANGE TIMEOUT ST (SAFETY) ×3 IMPLANT
COVER LIGHT HANDLE STERIS (MISCELLANEOUS) ×6 IMPLANT
DECANTER SPIKE VIAL GLASS SM (MISCELLANEOUS) ×3 IMPLANT
ELECT REM PT RETURN 9FT ADLT (ELECTROSURGICAL) ×3
ELECTRODE REM PT RTRN 9FT ADLT (ELECTROSURGICAL) ×1 IMPLANT
FILTER SMOKE EVAC LAPAROSHD (FILTER) ×3 IMPLANT
FORMALIN 10 PREFIL 120ML (MISCELLANEOUS) ×3 IMPLANT
GLOVE BIOGEL PI IND STRL 7.0 (GLOVE) ×1 IMPLANT
GLOVE BIOGEL PI IND STRL 7.5 (GLOVE) IMPLANT
GLOVE BIOGEL PI INDICATOR 7.0 (GLOVE) ×2
GLOVE BIOGEL PI INDICATOR 7.5 (GLOVE) ×4
GLOVE ECLIPSE 6.5 STRL STRAW (GLOVE) ×2 IMPLANT
GLOVE ECLIPSE 7.0 STRL STRAW (GLOVE) ×4 IMPLANT
GLOVE EXAM NITRILE MD LF STRL (GLOVE) ×2 IMPLANT
GLOVE SURG SS PI 7.5 STRL IVOR (GLOVE) ×3 IMPLANT
GOWN STRL REUS W/ TWL XL LVL3 (GOWN DISPOSABLE) ×1 IMPLANT
GOWN STRL REUS W/TWL LRG LVL3 (GOWN DISPOSABLE) ×6 IMPLANT
GOWN STRL REUS W/TWL XL LVL3 (GOWN DISPOSABLE) ×3
HEMOSTAT ARISTA ABSORB 3G PWDR (MISCELLANEOUS) ×2 IMPLANT
HEMOSTAT SNOW SURGICEL 2X4 (HEMOSTASIS) ×3 IMPLANT
INST SET LAPROSCOPIC AP (KITS) ×3 IMPLANT
IV NS IRRIG 3000ML ARTHROMATIC (IV SOLUTION) ×2 IMPLANT
KIT ROOM TURNOVER APOR (KITS) ×3 IMPLANT
MANIFOLD NEPTUNE II (INSTRUMENTS) ×3 IMPLANT
NDL INSUFFLATION 14GA 120MM (NEEDLE) ×1 IMPLANT
NEEDLE INSUFFLATION 14GA 120MM (NEEDLE) ×3 IMPLANT
NS IRRIG 1000ML POUR BTL (IV SOLUTION) ×3 IMPLANT
PACK LAP CHOLE LZT030E (CUSTOM PROCEDURE TRAY) ×3 IMPLANT
PAD ARMBOARD 7.5X6 YLW CONV (MISCELLANEOUS) ×3 IMPLANT
POUCH SPECIMEN RETRIEVAL 10MM (ENDOMECHANICALS) ×3 IMPLANT
SET BASIN LINEN APH (SET/KITS/TRAYS/PACK) ×3 IMPLANT
SET TUBE IRRIG SUCTION NO TIP (IRRIGATION / IRRIGATOR) ×2 IMPLANT
SLEEVE ENDOPATH XCEL 5M (ENDOMECHANICALS) ×3 IMPLANT
SPONGE GAUZE 2X2 8PLY STER LF (GAUZE/BANDAGES/DRESSINGS) ×4
SPONGE GAUZE 2X2 8PLY STRL LF (GAUZE/BANDAGES/DRESSINGS) ×8 IMPLANT
STAPLER VISISTAT (STAPLE) ×3 IMPLANT
SUT VICRYL 0 UR6 27IN ABS (SUTURE) ×3 IMPLANT
TAPE CLOTH SURG 4X10 WHT LF (GAUZE/BANDAGES/DRESSINGS) ×2 IMPLANT
TROCAR ENDO BLADELESS 11MM (ENDOMECHANICALS) ×3 IMPLANT
TROCAR XCEL NON-BLD 5MMX100MML (ENDOMECHANICALS) ×3 IMPLANT
TROCAR XCEL UNIV SLVE 11M 100M (ENDOMECHANICALS) ×3 IMPLANT
TUBE CONNECTING 12'X1/4 (SUCTIONS) ×1
TUBE CONNECTING 12X1/4 (SUCTIONS) ×2 IMPLANT
TUBING INSUFFLATION (TUBING) ×3 IMPLANT
WARMER LAPAROSCOPE (MISCELLANEOUS) ×3 IMPLANT

## 2016-11-06 NOTE — Anesthesia Postprocedure Evaluation (Signed)
Anesthesia Post Note  Patient: Ashlee Carpenter  Procedure(s) Performed: Procedure(s) (LRB): LAPAROSCOPIC CHOLECYSTECTOMY (N/A)  Patient location during evaluation: PACU Anesthesia Type: General Level of consciousness: awake and alert, oriented and patient cooperative Pain management: pain level controlled Vital Signs Assessment: post-procedure vital signs reviewed and stable Respiratory status: spontaneous breathing and non-rebreather facemask Cardiovascular status: stable Postop Assessment: no headache and no backache    Last Vitals:  Vitals:   11/06/16 0955 11/06/16 1123  BP: 128/71 (!) 167/74  Pulse:  (!) 55  Resp: (!) 34   Temp:  37.2 C    Last Pain:  Vitals:   11/06/16 1123  TempSrc: Oral  PainSc:                  Rether Rison

## 2016-11-06 NOTE — Anesthesia Preprocedure Evaluation (Addendum)
Anesthesia Evaluation  Patient identified by MRN, date of birth, ID band Patient awake    Reviewed: Allergy & Precautions, NPO status , Patient's Chart, lab work & pertinent test results  Airway Mallampati: II  TM Distance: >3 FB Neck ROM: Full    Dental  (+) Teeth Intact, Implants,    Pulmonary Current Smoker,    breath sounds clear to auscultation       Cardiovascular negative cardio ROS   Rhythm:Regular Rate:Normal     Neuro/Psych    GI/Hepatic GERD  Medicated,IBS RUQ pain + nausea    Endo/Other    Renal/GU      Musculoskeletal   Abdominal   Peds  Hematology   Anesthesia Other Findings   Reproductive/Obstetrics                            Anesthesia Physical Anesthesia Plan  ASA: II  Anesthesia Plan: General   Post-op Pain Management:    Induction: Intravenous, Rapid sequence and Cricoid pressure planned  Airway Management Planned: Oral ETT  Additional Equipment:   Intra-op Plan:   Post-operative Plan: Extubation in OR  Informed Consent: I have reviewed the patients History and Physical, chart, labs and discussed the procedure including the risks, benefits and alternatives for the proposed anesthesia with the patient or authorized representative who has indicated his/her understanding and acceptance.     Plan Discussed with:   Anesthesia Plan Comments:         Anesthesia Quick Evaluation

## 2016-11-06 NOTE — Addendum Note (Signed)
Addendum  created 11/06/16 1128 by Laretta Alstrom, CRNA   Anesthesia Attestations filed, Anesthesia Intra Flowsheets edited

## 2016-11-06 NOTE — Transfer of Care (Signed)
Immediate Anesthesia Transfer of Care Note  Patient: Ashlee Carpenter  Procedure(s) Performed: Procedure(s): LAPAROSCOPIC CHOLECYSTECTOMY (N/A)  Patient Location: PACU  Anesthesia Type:General  Level of Consciousness: awake, alert , oriented and patient cooperative  Airway & Oxygen Therapy: Patient Spontanous Breathing and Patient connected to face mask oxygen  Post-op Assessment: Report given to RN and Post -op Vital signs reviewed and stable  Post vital signs: Reviewed and stable  Last Vitals:  Vitals:   11/06/16 0955 11/06/16 1123  BP: 128/71 (!) 167/74  Pulse:  (!) 55  Resp: (!) 34   Temp:  37.2 C    Last Pain:  Vitals:   11/06/16 1123  TempSrc: Oral  PainSc:       Patients Stated Pain Goal: Other (Comment) ([id number])  Complications: No apparent anesthesia complications

## 2016-11-06 NOTE — Op Note (Signed)
Patient:  Ashlee Carpenter  DOB:  [birth year]  MRN:  [id number]   Preop Diagnosis:  Acute cholecystitis, cholelithiasis  Postop Diagnosis:  Same  Procedure:  Laparoscopic cholecystectomy  Surgeon:  Aviva Signs, M.D.  Assistant: Tama High, M.D.  Anes:  Gen. endotracheal  Indications:  Patient is a 55 year old white female who presents with acute cholecystitis secondary to cholelithiasis. The risks and benefits of the procedure including bleeding, infection, hepatobiliary injury, and the possibility of an open procedure were fully explained to the patient, who gave informed consent.  Procedure note:  The patient was placed the supine position. After induction of general endotracheal anesthesia, the abdomen was prepped and draped using the usual sterile technique with DuraPrep. Surgical site confirmation was performed.  A supraumbilical incision was made down to the fascia. A Veress needle was introduced into the abdominal cavity and confirmation of placement was done using the saline drop test. The abdomen was then insufflated to 16 mmHg pressure. An 11 mm trocar was introduced into the abdominal cavity under direct visualization without difficulty. The patient was placed in reverse Trendelenburg position and an additional 11 mm trocar was placed the epigastric region and 5 mm trochars were placed the right upper quadrant and right flank regions. Liver was inspected and noted to be within normal limits. The gallbladder was noted to be distended, inflamed, with a thickened gallbladder wall. The gallbladder lumen had to be decompressed in order to facilitate exposure. The gallbladder was then retracted in a dynamic fashion in order to provide a critical view of the triangle of Calot. The cystic duct was first identified. Its juncture to the infundibulum was fully identified. Endoclips were placed proximally and distally on the cystic duct, and the cystic duct was divided. This was likewise done  the cystic artery. The gallbladder was then freed away from the gallbladder fossa using Bovie electrocautery. The gallbladder was delivered through the epigastric trocar site using an Endo Catch bag. The gallbladder fossa was inspected and no abnormal bleeding or bile leakage was noted. Surgicel was placed the gallbladder fossa as well as Arista. All fluid and air were then evacuated from the abdominal cavity prior to removal of the trochars.  All wounds were irrigated with normal saline. All wounds were injected with 0.5% Sensorcaine. The epigastric fascia was reapproximated using an 0 Vicryl interrupted suture. All skin incisions were closed using staples. Betadine ointment and dry sterile dressings were applied.  All tape and needle counts were correct at the end of the procedure. The patient was extubated in the operating room and transferred to PACU in stable condition.  Complications:  None  EBL:  50 mL  Specimen:  Gallbladder

## 2016-11-07 LAB — HEPATIC FUNCTION PANEL
ALK PHOS: 125 U/L (ref 38–126)
ALT: 120 U/L — ABNORMAL HIGH (ref 14–54)
AST: 100 U/L — ABNORMAL HIGH (ref 15–41)
Albumin: 3 g/dL — ABNORMAL LOW (ref 3.5–5.0)
BILIRUBIN INDIRECT: 0.6 mg/dL (ref 0.3–0.9)
BILIRUBIN TOTAL: 1 mg/dL (ref 0.3–1.2)
Bilirubin, Direct: 0.4 mg/dL (ref 0.1–0.5)
Total Protein: 5.7 g/dL — ABNORMAL LOW (ref 6.5–8.1)

## 2016-11-07 LAB — CBC
HCT: 37.4 % (ref 36.0–46.0)
Hemoglobin: 12.3 g/dL (ref 12.0–15.0)
MCH: 29.1 pg (ref 26.0–34.0)
MCHC: 32.9 g/dL (ref 30.0–36.0)
MCV: 88.4 fL (ref 78.0–100.0)
PLATELETS: 213 10*3/uL (ref 150–400)
RBC: 4.23 MIL/uL (ref 3.87–5.11)
RDW: 13.9 % (ref 11.5–15.5)
WBC: 10.7 10*3/uL — AB (ref 4.0–10.5)

## 2016-11-07 LAB — BASIC METABOLIC PANEL
ANION GAP: 6 (ref 5–15)
BUN: 5 mg/dL — ABNORMAL LOW (ref 6–20)
CALCIUM: 8.7 mg/dL — AB (ref 8.9–10.3)
CO2: 27 mmol/L (ref 22–32)
Chloride: 105 mmol/L (ref 101–111)
Creatinine, Ser: 0.55 mg/dL (ref 0.44–1.00)
GFR calc Af Amer: >60 mL/min (ref >60)
Glucose, Bld: 87 mg/dL (ref 65–99)
Potassium: 3.2 mmol/L — ABNORMAL LOW (ref 3.5–5.1)
Sodium: 138 mmol/L (ref 135–145)

## 2016-11-07 MED ORDER — ONDANSETRON HCL 4 MG PO TABS: 4.0000 mg | ORAL_TABLET | Freq: Three times a day (TID) | ORAL | 1 refills | Status: DC | PRN

## 2016-11-07 MED ORDER — OXYCODONE-ACETAMINOPHEN 7.5-325 MG PO TABS: 1.0000 | ORAL_TABLET | ORAL | 0 refills | Status: DC | PRN

## 2016-11-07 NOTE — Progress Notes (Signed)
Pt's IV catheter removed and intact. Pt's IV site clean dry and intact. Discharge instructions including medications and follow up appointments were reviewed and discussed with patient. Pt verbalized understanding of discharge instructions including medications and follow up appointments. All questions were answered and no further questions at this time. Pt in stable condition and in no acute distress at time of discharge. Pt will be escorted by nurse tech.  

## 2016-11-07 NOTE — Discharge Instructions (Signed)
Laparoscopic Cholecystectomy, Care After °This sheet gives you information about how to care for yourself after your procedure. Your health care provider may also give you more specific instructions. If you have problems or questions, contact your health care provider. °What can I expect after the procedure? °After the procedure, it is common to have: °· Pain at your incision sites. You will be given medicines to control this pain. °· Mild nausea or vomiting. °· Bloating and possible shoulder pain from the air-like gas that was used during the procedure. °Follow these instructions at home: °Incision care  ° °· Follow instructions from your health care provider about how to take care of your incisions. Make sure you: °¨ Wash your hands with soap and water before you change your bandage (dressing). If soap and water are not available, use hand sanitizer. °¨ Change your dressing as told by your health care provider. °¨ Leave stitches (sutures), skin glue, or adhesive strips in place. These skin closures may need to be in place for 2 weeks or longer. If adhesive strip edges start to loosen and curl up, you may trim the loose edges. Do not remove adhesive strips completely unless your health care provider tells you to do that. °· Do not take baths, swim, or use a hot tub until your health care provider approves. Ask your health care provider if you can take showers. You may only be allowed to take sponge baths for bathing. °· Check your incision area every day for signs of infection. Check for: °¨ More redness, swelling, or pain. °¨ More fluid or blood. °¨ Warmth. °¨ Pus or a bad smell. °Activity  °· Do not drive or use heavy machinery while taking prescription pain medicine. °· Do not lift anything that is heavier than 10 lb (4.5 kg) until your health care provider approves. °· Do not play contact sports until your health care provider approves. °· Do not drive for 24 hours if you were given a medicine to help you relax  (sedative). °· Rest as needed. Do not return to work or school until your health care provider approves. °General instructions  °· Take over-the-counter and prescription medicines only as told by your health care provider. °· To prevent or treat constipation while you are taking prescription pain medicine, your health care provider may recommend that you: °¨ Drink enough fluid to keep your urine clear or pale yellow. °¨ Take over-the-counter or prescription medicines. °¨ Eat foods that are high in fiber, such as fresh fruits and vegetables, whole grains, and beans. °¨ Limit foods that are high in fat and processed sugars, such as fried and sweet foods. °Contact a health care provider if: °· You develop a rash. °· You have more redness, swelling, or pain around your incisions. °· You have more fluid or blood coming from your incisions. °· Your incisions feel warm to the touch. °· You have pus or a bad smell coming from your incisions. °· You have a fever. °· One or more of your incisions breaks open. °Get help right away if: °· You have trouble breathing. °· You have chest pain. °· You have increasing pain in your shoulders. °· You faint or feel dizzy when you stand. °· You have severe pain in your abdomen. °· You have nausea or vomiting that lasts for more than one day. °· You have leg pain. °This information is not intended to replace advice given to you by your health care provider. Make sure you discuss any   questions you have with your health care provider. °Document Released: 11/16/2005 Document Revised: 06/06/2016 Document Reviewed: 05/04/2016 °[employer] Interactive Patient Education © 2017 [employer] ° °

## 2016-11-07 NOTE — Discharge Summary (Signed)
Physician Discharge Summary  Patient ID: NYKERA RUNKEL MRN: [id number] DOB/AGE: 55-Jan-1962 55 y.o.  Admit date: 11/05/2016 Discharge date: 11/07/2016  Admission Diagnoses:Acute cholecystitis, cholelithiasis  Discharge Diagnoses: Same Principal Problem:   Acute cholecystitis   Discharged Condition: good  Hospital Course: Patient is a 55 year old white female with multiple gastrointestinal complaints and presented to the emergency room with right upper quadrant abdominal pain and nausea. She was found on workup to have acute cholecystitis with cholelithiasis. She was initially admitted by the hospitalists. Surgery consultation was obtained and the patient was taken to the operating room on 11/06/2016 and underwent laparoscopic cholecystectomy. She tolerated the procedure well. Her postoperative course has been unremarkable. Her diet was advanced without difficulty. The patient is being discharged home on postoperative day one in good and improving condition.  Treatments: surgery: Laparoscopic cholecystectomy on 11/06/2016  Discharge Exam: Blood pressure (!) 143/54, pulse 69, temperature 98.1 F (36.7 C), temperature source Oral, resp. rate 18, height 5\' 5"  (1.651 m), weight 78.9 kg (174 lb), SpO2 97 %. General appearance: alert, cooperative and no distress Resp: clear to auscultation bilaterally Cardio: regular rate and rhythm, S1, S2 normal, no murmur, click, rub or gallop GI: Soft, dressings dry and intact.  Disposition: Home    Medication List    TAKE these medications   ALPRAZolam 0.5 MG tablet Commonly known as:  XANAX TAKE 1/2 TO 1 TABLET BY MOUTH TWICE DAILY AS NEEDED FOR ANXIETY   [employer] DIGESTIVE PROBIOTIC PO Take 1 tablet by mouth at bedtime.   DIGESTIVE ENZYME PO Take 1 tablet by mouth 3 (three) times daily with meals.   OMEGA-3 COMPLEX PO Take 1 capsule by mouth at bedtime.   ondansetron 4 MG tablet Commonly known as:  ZOFRAN Take 1 tablet (4 mg total) by  mouth every 8 (eight) hours as needed for nausea or vomiting.   OVER THE COUNTER MEDICATION Take 1 tablet by mouth 3 (three) times daily with meals. Hydrochloric acid prozyme   OVER THE COUNTER MEDICATION Take 0.8 mLs by mouth at bedtime. Antitox Kdny-DRN (kidney detox)   OVER THE COUNTER MEDICATION Take 1 tablet by mouth 2 (two) times daily. Citrus and Pinellia (anti-inflammatory, get rid of mucous, Herb)   OVER THE COUNTER MEDICATION Take 1 tablet by mouth daily as needed. Coptis Rehmannia (anti-viral, antibacterial, anti-inflammatory herb)   OVER THE COUNTER MEDICATION Take 1 tablet by mouth 2 (two) times daily. Tangkuei and Bupleurun ("supposed to unstick whatever is stuck in body") "made me worse" says pt Only took 2 tablets of this med   oxyCODONE-acetaminophen 7.5-325 MG tablet Commonly known as:  PERCOCET Take 1-2 tablets by mouth every 4 (four) hours as needed.      Follow-up Information    Jamesetta So, MD. Schedule an appointment as soon as possible for a visit on 11/12/2016.   Specialty:  General Surgery Contact information: [id number] [place] [id number] [phone]           Signed: Aviva Signs A 11/07/2016, 11:15 AM

## 2016-11-08 LAB — MRSA CULTURE: Culture: NOT DETECTED

## 2016-11-09 ENCOUNTER — Encounter (HOSPITAL_COMMUNITY): Payer: Self-pay | Admitting: General Surgery

## 2017-05-15 ENCOUNTER — Encounter: Payer: Self-pay | Admitting: Family Medicine

## 2017-05-19 ENCOUNTER — Other Ambulatory Visit: Payer: Self-pay | Admitting: Nurse Practitioner

## 2017-05-19 MED ORDER — BUPROPION HCL ER (XL) 150 MG PO TB24: 150.0000 mg | ORAL_TABLET | Freq: Every day | ORAL | 2 refills | Status: DC

## 2017-07-13 ENCOUNTER — Encounter: Payer: Self-pay | Admitting: Family Medicine

## 2017-07-13 ENCOUNTER — Other Ambulatory Visit: Payer: Self-pay | Admitting: Nurse Practitioner

## 2017-07-13 MED ORDER — ALPRAZOLAM 0.5 MG PO TABS: 0.2500 mg | ORAL_TABLET | Freq: Two times a day (BID) | ORAL | 0 refills | Status: DC | PRN

## 2017-08-27 ENCOUNTER — Other Ambulatory Visit: Payer: Self-pay | Admitting: Nurse Practitioner

## 2017-08-27 ENCOUNTER — Encounter: Payer: Self-pay | Admitting: Family Medicine

## 2017-08-27 MED ORDER — ALPRAZOLAM 0.5 MG PO TABS: 0.2500 mg | ORAL_TABLET | Freq: Two times a day (BID) | ORAL | 2 refills | Status: DC | PRN

## 2019-08-14 ENCOUNTER — Encounter: Payer: Self-pay | Admitting: Family Medicine

## 2019-08-14 ENCOUNTER — Ambulatory Visit: Payer: BC Managed Care – PPO | Admitting: Family Medicine

## 2019-08-14 ENCOUNTER — Other Ambulatory Visit: Payer: Self-pay

## 2019-08-14 VITALS — BP 122/78 | Temp 98.4°F | Ht 65.0 in | Wt 190.8 lb

## 2019-08-14 DIAGNOSIS — Z13220 Encounter for screening for lipoid disorders: Secondary | ICD-10-CM

## 2019-08-14 DIAGNOSIS — R5383 Other fatigue: Secondary | ICD-10-CM | POA: Diagnosis not present

## 2019-08-14 DIAGNOSIS — R404 Transient alteration of awareness: Secondary | ICD-10-CM

## 2019-08-14 DIAGNOSIS — G43109 Migraine with aura, not intractable, without status migrainosus: Secondary | ICD-10-CM | POA: Diagnosis not present

## 2019-08-14 NOTE — Progress Notes (Signed)
Subjective:    Patient ID: Ashlee Carpenter, female    DOB: 07/12/1961, 58 y.o.   MRN: [id number] Very nice patient Here today because of multiple different veins  HPIMigraine headaches.  Having migraine related headaches she describes as ocular migraines with visual changes and then a mild headache afterwards.  Typically she toughs it out or takes Tylenol for or occasionally ibuprofen  She also describes several different times where her right eye turns inward and her right eyelid droops in the last 3 to 4 minutes then it corrects itself and goes away this is happened several different times she denies waking up in the middle night with a headache she also denies any right-sided weakness in the arm or leg.  She states she did see her eye specialist who told her that the eyes look good but they are concerned about mini strokes  Ears feel full. Comes and goes for a few months.   Would like to get screening bloodwork and a test for mold.  Patient would like to have some blood work done to look for various problems she is concerned about fatigue tiredness she has and also possibility of vitamin D deficiency in addition to this she was requesting a test for mold but I told her that there really was not a test to look for that other than going to see allergist she does not want to do that currently Review of Systems  Constitutional: Negative for activity change, appetite change and fatigue.  HENT: Negative for congestion and rhinorrhea.   Respiratory: Negative for cough and shortness of breath.   Cardiovascular: Negative for chest pain and leg swelling.  Gastrointestinal: Negative for abdominal pain and diarrhea.  Endocrine: Negative for polydipsia and polyphagia.  Skin: Negative for color change.  Neurological: Positive for dizziness, facial asymmetry, light-headedness and headaches. Negative for weakness.  Psychiatric/Behavioral: Negative for behavioral problems and confusion.        Objective:   Physical Exam Vitals signs reviewed.  Constitutional:      General: She is not in acute distress. HENT:     Head: Normocephalic and atraumatic.  Eyes:     General:        Right eye: No discharge.        Left eye: No discharge.  Neck:     Trachea: No tracheal deviation.  Cardiovascular:     Rate and Rhythm: Normal rate and regular rhythm.     Heart sounds: Normal heart sounds. No murmur.  Pulmonary:     Effort: Pulmonary effort is normal. No respiratory distress.     Breath sounds: Normal breath sounds.  Lymphadenopathy:     Cervical: No cervical adenopathy.  Skin:    General: Skin is warm and dry.  Neurological:     Mental Status: She is alert.     Coordination: Coordination normal.  Psychiatric:        Behavior: Behavior normal.    EOMI finger-to-nose normal Romberg negative can walk a straight line in the hallway without difficulty       Assessment & Plan:  1. Ocular migraine Because of her frequency of ocular migraines where this was not happening previously I would recommend an MRI of the brain as well as carotid ultrasound see below - Lipid panel - Hepatic function panel - VITAMIN D 25 Hydroxy (Vit-D Deficiency, Fractures) - CBC with Differential/Platelet - Basic metabolic panel - TSH - T4, free  2. Altered awareness, transient With her having right side  facial weakness in the right eye as well as eyelid as well as having these migraine issues become rather frequent lately MRI of the brain along with carotid ultrasound is recommended.  The patient states that she will look into the cost of doing an MRI and a carotid study and she will let us know somewhere within the course of the next 7 to 14 days so we can find out whether or not she wants to go ahead with this testing I recommended MRI and carotid ultrasound but also told her we could refer her to neurologist if she is interested she defers on that currently - Lipid panel - Hepatic function panel  - VITAMIN D 25 Hydroxy (Vit-D Deficiency, Fractures) - CBC with Differential/Platelet - Basic metabolic panel - TSH - T4, free  3. Other fatigue Lab work indicated lipid screening but also other lab work because of fatigue - Lipid panel - Hepatic function panel - VITAMIN D 25 Hydroxy (Vit-D Deficiency, Fractures) - CBC with Differential/Platelet - Basic metabolic panel - TSH - T4, free  4. Screening for lipid disorders Lipid screening - Lipid panel   25 minutes was spent with the patient.  This statement verifies that 25 minutes was indeed spent with the patient.  More than 50% of this visit-total duration of the visit-was spent in counseling and coordination of care. The issues that the patient came in for today as reflected in the diagnosis (s) please refer to documentation for further details.

## 2019-08-15 ENCOUNTER — Telehealth: Payer: Self-pay | Admitting: Family Medicine

## 2019-08-15 DIAGNOSIS — R404 Transient alteration of awareness: Secondary | ICD-10-CM

## 2019-08-15 DIAGNOSIS — G459 Transient cerebral ischemic attack, unspecified: Secondary | ICD-10-CM

## 2019-08-15 DIAGNOSIS — G43109 Migraine with aura, not intractable, without status migrainosus: Secondary | ICD-10-CM

## 2019-08-15 LAB — CBC WITH DIFFERENTIAL/PLATELET
Basophils Absolute: 0.1 10*3/uL (ref 0.0–0.2)
Basos: 1 %
EOS (ABSOLUTE): 0.2 10*3/uL (ref 0.0–0.4)
Eos: 2 %
Hematocrit: 44 % (ref 34.0–46.6)
Hemoglobin: 14.4 g/dL (ref 11.1–15.9)
Immature Grans (Abs): 0.1 10*3/uL (ref 0.0–0.1)
Immature Granulocytes: 1 %
Lymphocytes Absolute: 2.6 10*3/uL (ref 0.7–3.1)
Lymphs: 26 %
MCH: 28.5 pg (ref 26.6–33.0)
MCHC: 32.7 g/dL (ref 31.5–35.7)
MCV: 87 fL (ref 79–97)
Monocytes Absolute: 0.8 10*3/uL (ref 0.1–0.9)
Monocytes: 8 %
Neutrophils Absolute: 6.4 10*3/uL (ref 1.4–7.0)
Neutrophils: 62 %
Platelets: 249 10*3/uL (ref 150–450)
RBC: 5.06 x10E6/uL (ref 3.77–5.28)
RDW: 13.4 % (ref 11.7–15.4)
WBC: 10.1 10*3/uL (ref 3.4–10.8)

## 2019-08-15 LAB — BASIC METABOLIC PANEL
BUN/Creatinine Ratio: 17 (ref 9–23)
BUN: 11 mg/dL (ref 6–24)
CO2: 25 mmol/L (ref 20–29)
Calcium: 9.5 mg/dL (ref 8.7–10.2)
Chloride: 103 mmol/L (ref 96–106)
Creatinine, Ser: 0.64 mg/dL (ref 0.57–1.00)
GFR calc Af Amer: 114 mL/min/{1.73_m2} (ref >59)
GFR calc non Af Amer: 99 mL/min/{1.73_m2} (ref >59)
Glucose: 87 mg/dL (ref 65–99)
Potassium: 3.8 mmol/L (ref 3.5–5.2)
Sodium: 142 mmol/L (ref 134–144)

## 2019-08-15 LAB — HEPATIC FUNCTION PANEL
ALT: 20 IU/L (ref 0–32)
AST: 17 IU/L (ref 0–40)
Albumin: 4.1 g/dL (ref 3.8–4.9)
Alkaline Phosphatase: 106 IU/L (ref 39–117)
Bilirubin Total: 0.3 mg/dL (ref 0.0–1.2)
Bilirubin, Direct: 0.11 mg/dL (ref 0.00–0.40)
Total Protein: 6.7 g/dL (ref 6.0–8.5)

## 2019-08-15 LAB — LIPID PANEL
Chol/HDL Ratio: 4.2 ratio (ref 0.0–4.4)
Cholesterol, Total: 203 mg/dL — ABNORMAL HIGH (ref 100–199)
HDL: 48 mg/dL (ref >39)
LDL Chol Calc (NIH): 122 mg/dL — ABNORMAL HIGH (ref 0–99)
Triglycerides: 188 mg/dL — ABNORMAL HIGH (ref 0–149)
VLDL Cholesterol Cal: 33 mg/dL (ref 5–40)

## 2019-08-15 LAB — VITAMIN D 25 HYDROXY (VIT D DEFICIENCY, FRACTURES): Vit D, 25-Hydroxy: 28 ng/mL — ABNORMAL LOW (ref 30.0–100.0)

## 2019-08-15 LAB — TSH: TSH: 0.685 u[IU]/mL (ref 0.450–4.500)

## 2019-08-15 LAB — T4, FREE: Free T4: 1.15 ng/dL (ref 0.82–1.77)

## 2019-08-15 NOTE — Telephone Encounter (Signed)
CPT code  Bilateral carotid ultrasound 93880 CPT code MRI brain without contrast 70551  Patient notified and verbalized understanding.

## 2019-08-15 NOTE — Telephone Encounter (Signed)
Please order MRI of the brain as well as carotid ultrasound Please do this at [facility] imaging as requested Diagnosis TIA, temporary ocular muscle palsy, altered sensation, frequent ocular migraines  Please move forward with setting these up getting them approved and notifying patient thank you

## 2019-08-15 NOTE — Telephone Encounter (Signed)
Pt needs CPT code for the MRI & ultrasound that Dr. Nicki Reaper was going to order yesterday, she's calling facilities to get pricing & needs CPT codes  MRI Brain without & U/S of carotid artery   Please advise & call pt

## 2019-08-15 NOTE — Telephone Encounter (Signed)
Patient saw Dr. Nicki Reaper yesterday and was told to check around for prices on scans.  She said she would like to have her scans done at [place] phone: [phone]  Fax : [phone] Prefers Monday or Tuesday  MRI  Brain w/o and U/S-Carotid

## 2019-08-15 NOTE — Telephone Encounter (Signed)
MRI and Carotid Ultrasound ordered in Epic.

## 2019-08-17 MED ORDER — VITAMIN D (ERGOCALCIFEROL) 1.25 MG (50000 UNIT) PO CAPS: ORAL_CAPSULE | ORAL | 0 refills | Status: DC

## 2019-08-17 NOTE — Addendum Note (Signed)
Addended by: Vicente Males on: 08/17/2019 01:16 PM   Modules accepted: Orders

## 2019-08-18 ENCOUNTER — Telehealth: Payer: Self-pay | Admitting: Family Medicine

## 2019-08-18 NOTE — Telephone Encounter (Signed)
Please sign Rx orders for pt's Ultrasound-Carotid & [facility] MRI - forward to [place] to be faxed  In yellow box on wall

## 2019-08-20 NOTE — Telephone Encounter (Signed)
These were signed thank you Since these are being completed at a facility outside of [facility] Please write down when these tests are in follow-up to make sure results are to [place] within 5 days of the test thank you (In other words please take responsibility to follow-up to make sure that we got results and that they were shown to me thank you)

## 2019-08-21 NOTE — Telephone Encounter (Signed)
Please advise. Thank you

## 2019-08-21 NOTE — Telephone Encounter (Signed)
Pt is scheduled for both Carotid U/S & Brain MRI on 09/01/2019 @ [facility]  (pt mentioned she may call to reschedule)\  [facility] - ph# [phone], fx# [phone]

## 2019-09-04 ENCOUNTER — Encounter: Payer: Self-pay | Admitting: Family Medicine

## 2019-09-08 ENCOUNTER — Telehealth: Payer: Self-pay | Admitting: Family Medicine

## 2019-09-08 NOTE — Telephone Encounter (Signed)
Left message to return call 

## 2019-09-08 NOTE — Telephone Encounter (Signed)
I reviewed over the results of MRI and carotid study that patient recently had Carotid study shows no significant buildup issues The MRI has an area that there is a small spot neck could be a old stroke but it is also possible that this could be a normal variant Her MRI was completed at [facility] imaging center Based on this finding it is recommended for neurology consult It will be important for the patient to get a CD-ROM of her MRI to take with her when she goes to see the neurologist. The neurologist will be able to help determine if this was a old mini stroke or if this is a normal variant finding

## 2019-09-08 NOTE — Telephone Encounter (Signed)
results

## 2019-09-11 NOTE — Telephone Encounter (Signed)
Pt returned call and verbalized understanding. Pt is going to call her insurance to see which neurologist is preferred by her insurance and she will give [place] a call back.

## 2020-01-23 ENCOUNTER — Telehealth: Payer: Self-pay | Admitting: Family Medicine

## 2020-01-23 NOTE — Telephone Encounter (Signed)
Pt has CPE with [facility] Sauer in April and would like to know if she needs labs work done?

## 2020-01-24 NOTE — Telephone Encounter (Signed)
Pt.notified

## 2020-01-24 NOTE — Telephone Encounter (Signed)
Labs were done in September. Nothing needs to be repeated until this fall. Thanks.

## 2020-03-22 ENCOUNTER — Other Ambulatory Visit: Payer: Self-pay

## 2020-03-22 ENCOUNTER — Encounter: Payer: Self-pay | Admitting: Nurse Practitioner

## 2020-03-22 ENCOUNTER — Ambulatory Visit (INDEPENDENT_AMBULATORY_CARE_PROVIDER_SITE_OTHER): Payer: BC Managed Care – PPO | Admitting: Nurse Practitioner

## 2020-03-22 VITALS — BP 132/72 | Temp 97.1°F | Ht 65.0 in | Wt 193.6 lb

## 2020-03-22 DIAGNOSIS — Z01419 Encounter for gynecological examination (general) (routine) without abnormal findings: Secondary | ICD-10-CM

## 2020-03-22 DIAGNOSIS — Z1151 Encounter for screening for human papillomavirus (HPV): Secondary | ICD-10-CM

## 2020-03-22 DIAGNOSIS — Z720 Tobacco use: Secondary | ICD-10-CM

## 2020-03-22 DIAGNOSIS — Z1231 Encounter for screening mammogram for malignant neoplasm of breast: Secondary | ICD-10-CM | POA: Diagnosis not present

## 2020-03-22 DIAGNOSIS — Z124 Encounter for screening for malignant neoplasm of cervix: Secondary | ICD-10-CM

## 2020-03-22 NOTE — Progress Notes (Signed)
   Subjective:    Patient ID: Ashlee Carpenter, female    DOB: [birth year], 59 y.o.   MRN: [id number]  HPI presents today for her well woman's exam. Same sexual partner although she denies having sex in 77 years.   Review of Systems  Respiratory: Negative for cough, chest tightness, shortness of breath and wheezing.   Cardiovascular: Negative for chest pain, palpitations and leg swelling.  Gastrointestinal: Positive for diarrhea. Negative for abdominal pain, blood in stool, constipation and rectal pain.       Positive for history of IBS  Genitourinary: Negative for frequency, urgency, vaginal bleeding and vaginal pain.  Psychiatric/Behavioral: Negative for self-injury and suicidal ideas.       Objective:   Physical Exam Neck:     Thyroid: No thyroid mass, thyromegaly or thyroid tenderness.     Trachea: Trachea normal.  Cardiovascular:     Rate and Rhythm: Normal rate and regular rhythm.     Heart sounds: Normal heart sounds.  Pulmonary:     Breath sounds: Normal breath sounds.  Chest:     Breasts: Breasts are symmetrical.     Comments: Bilateral axillary fatty deposits. Dense breast tissue Abdominal:     General: There is no distension.     Palpations: Abdomen is soft. There is no mass.  Genitourinary:    Labia:        Right: No tenderness or lesion.        Left: No tenderness or lesion.      Urethra: No urethral pain or urethral lesion.     Vagina: Normal.     Cervix: Normal.  Musculoskeletal:     Right lower leg: No edema.     Left lower leg: No edema.  Lymphadenopathy:     Cervical: No cervical adenopathy.  Skin:    Comments: Psoriatic plaque upper gluteal cleft  Neurological:     General: No focal deficit present.     Mental Status: She is alert.  Psychiatric:        Attention and Perception: Attention and perception normal.        Mood and Affect: Mood normal.           Assessment & Plan:  Education:  Exercise as often as you can. Daily outdoor  activities and walking. Eat healthy diet to maintain weight. Scheduled Mammogram. Will follow up about your Colonoscopy once you verify with your insurance. Patient wants to see if she can do this in [place]. Will get back with [place].  Plan Hep-C and HIV with yearly labs in the fall.  Discussed smoking cessation. Decrease the amount of cigerates you smoke. Consider low dose CT scan of the lungs.  Return in about 1 year (around 03/22/2021) for physical; yearly labs this fall.

## 2020-03-22 NOTE — Progress Notes (Signed)
   Subjective:    Patient ID: Ashlee Carpenter, female    DOB: Jan 09, 1961, 59 y.o.   MRN: [id number]  HPI The patient comes in today for a wellness visit.    A review of their health history was completed.  A review of medications was also completed.  Any needed refills; none  Eating habits: tries to be healthy   Falls/  MVA accidents in past few months: none  Regular exercise: tries to walk   Specialist pt sees on regular basis: chiropractor and acupuncture   Preventative health issues were discussed.   Additional concerns: none   Review of Systems     Objective:   Physical Exam        Assessment & Plan:

## 2020-03-23 ENCOUNTER — Encounter: Payer: Self-pay | Admitting: Nurse Practitioner

## 2020-03-23 DIAGNOSIS — Z720 Tobacco use: Secondary | ICD-10-CM | POA: Insufficient documentation

## 2020-03-26 ENCOUNTER — Encounter: Payer: Self-pay | Admitting: Nurse Practitioner

## 2020-03-27 ENCOUNTER — Ambulatory Visit (HOSPITAL_COMMUNITY): Payer: BC Managed Care – PPO

## 2020-03-27 LAB — IGP, APTIMA HPV: HPV Aptima: NEGATIVE

## 2020-04-01 ENCOUNTER — Ambulatory Visit (HOSPITAL_COMMUNITY): Payer: BC Managed Care – PPO

## 2020-04-22 ENCOUNTER — Ambulatory Visit (HOSPITAL_COMMUNITY)
Admission: RE | Admit: 2020-04-22 | Discharge: 2020-04-22 | Disposition: A | Discharge status: Home / Self Care | Payer: BC Managed Care – PPO | Source: Ambulatory Visit | Attending: Nurse Practitioner | Admitting: Nurse Practitioner

## 2020-04-22 ENCOUNTER — Other Ambulatory Visit: Payer: Self-pay

## 2020-04-22 DIAGNOSIS — Z1231 Encounter for screening mammogram for malignant neoplasm of breast: Secondary | ICD-10-CM | POA: Insufficient documentation

## 2020-04-23 ENCOUNTER — Other Ambulatory Visit (HOSPITAL_COMMUNITY): Payer: Self-pay | Admitting: Nurse Practitioner

## 2020-04-23 DIAGNOSIS — R928 Other abnormal and inconclusive findings on diagnostic imaging of breast: Secondary | ICD-10-CM

## 2020-05-07 ENCOUNTER — Ambulatory Visit (HOSPITAL_COMMUNITY)
Admission: RE | Admit: 2020-05-07 | Discharge: 2020-05-07 | Disposition: A | Discharge status: Home / Self Care | Payer: BC Managed Care – PPO | Source: Ambulatory Visit | Attending: Nurse Practitioner | Admitting: Nurse Practitioner

## 2020-05-07 ENCOUNTER — Other Ambulatory Visit: Payer: Self-pay

## 2020-05-07 DIAGNOSIS — R928 Other abnormal and inconclusive findings on diagnostic imaging of breast: Secondary | ICD-10-CM

## 2020-05-11 ENCOUNTER — Encounter: Payer: Self-pay | Admitting: Nurse Practitioner

## 2020-09-16 ENCOUNTER — Encounter: Payer: Self-pay | Admitting: Nurse Practitioner

## 2020-09-16 ENCOUNTER — Other Ambulatory Visit: Payer: Self-pay | Admitting: *Deleted

## 2020-09-16 DIAGNOSIS — Z1211 Encounter for screening for malignant neoplasm of colon: Secondary | ICD-10-CM

## 2020-09-16 NOTE — Telephone Encounter (Signed)
Nurses Please see patient's message Please go ahead with the referral Notify patient as well

## 2020-12-09 ENCOUNTER — Telehealth: Payer: Self-pay | Admitting: *Deleted

## 2020-12-09 ENCOUNTER — Ambulatory Visit (AMBULATORY_SURGERY_CENTER): Payer: BC Managed Care – PPO | Admitting: *Deleted

## 2020-12-09 ENCOUNTER — Other Ambulatory Visit: Payer: Self-pay

## 2020-12-09 VITALS — Ht 65.0 in | Wt 190.0 lb

## 2020-12-09 DIAGNOSIS — Z800 Family history of malignant neoplasm of digestive organs: Secondary | ICD-10-CM

## 2020-12-09 DIAGNOSIS — Z01818 Encounter for other preprocedural examination: Secondary | ICD-10-CM

## 2020-12-09 MED ORDER — SUTAB 1479-225-188 MG PO TABS: 24.0000 | ORAL_TABLET | ORAL | 0 refills | Status: DC

## 2020-12-09 NOTE — Telephone Encounter (Signed)
Thanks for letting me know. Please keep [place] posted in regards to her COVID test.  Jenny Reichmann what do you make of her history, unclear to me if she truly ever had a seizure. Her PCP was concerned about ocular migraines, did an MRI which showed a ? old infarct. Neurology evaluation was recommended but don't see what she had this done. Do you want her to get this evaluated prior to consideration for a procedure at the [facility]? Sounds like no symptoms for 6-9 months. Thanks for your opinion.

## 2020-12-09 NOTE — Progress Notes (Signed)
Pt verified name, DOB, address and insurance during PV today. Pt mailed instruction packet to included paper to complete and mail back to [facility] with addressed and stamped envelope, Emmi video, copy of consent form to read and not return, and instructions. [employer] coupon mailed in packet. PV completed over the phone. Pt encouraged to call with questions or issues -  My chart instructions to pt as well   mini seizure in right eye with fragrance exposure -  gets dizzy- last 30 secs and gone- MRI in the past - not ever seen a neurologist - last episode 6-9 months ago - TE SA - ? Ok to proceed   No egg or soy allergy known to patient  No issues with past sedation with any surgeries or procedures No intubation problems in the past  No FH of Malignant Hyperthermia No diet pills per patient No home 02 use per patient  No blood thinners per patient  Pt denies issues with constipation  No A fib or A flutter  EMMI video to pt or via [place] 19 guidelines implemented in [place] today with Pt and RN  Pt is not  fully vaccinated  for Covid - test 1-20 Thursday [place] 8-1 pm- will RS if 12-10-20 covid test is +   Pt had covid exposure at work - has a covid test 12-10-2020- she does have chills, Headache and cough- will call if test is positive to RS until after the 25th of January Tomorrow 12-10-2020   Sutab Coupon given to pt in PV today , Code to Pharmacy   Due to the COVID-19 pandemic we are asking patients to follow certain guidelines.  Pt aware of COVID protocols and [facility] guidelines

## 2020-12-09 NOTE — Telephone Encounter (Signed)
Dr Havery Moros  This nice lady is scheduled to have a colonoscopy with you 12-23-2020- she may have to RS as she has had a Covid exposure and has s/s- she will let me know tomorrow after her test -  And we will RS her if she is positive- however ----  She explained to me at her Virtual PV today that she has had  mini seizure in right eye with fragrance exposure -  gets dizzy- last 30 secs and gone- MRI in the past showed " spots" that she was told was normal - not ever seen a neurologist - last episode 6-9 months ago - is she ok to have her colon as scheduled ?  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

## 2020-12-10 NOTE — Telephone Encounter (Signed)
Team,  She is cleared for care at [facility].  Thanks,  Osvaldo Angst

## 2020-12-10 NOTE — Telephone Encounter (Signed)
Pt called states covid test today negative BUT has been advised to restest Friday 1-14- pt will call with Friday results

## 2020-12-19 ENCOUNTER — Other Ambulatory Visit: Payer: BC Managed Care – PPO

## 2020-12-19 NOTE — Telephone Encounter (Signed)
Pt canceled 1-24 Colon

## 2020-12-23 ENCOUNTER — Encounter: Payer: BC Managed Care – PPO | Admitting: Gastroenterology

## 2021-02-26 ENCOUNTER — Telehealth: Payer: Self-pay | Admitting: Gastroenterology

## 2021-02-26 NOTE — Telephone Encounter (Signed)
Spoke with patient, she reports right side lower back pain that started a couple of months ago, lasts up to 3 days - comes and goes, she states that the pain mimics IBS. This pain is constant pain in right lower back and pain radiates to her right side and causes right sided lower abdominal pain. Patient states that today is the first time she noticed burning with urination. She reports that she gets chills sometimes with the pain but not all of the time, denies any fever. I have advised patient to check with PCP to see if they can check her for a UTI or possibly kidney stones sometime this week.  Patient wants to know if it is okay for her to proceed with colonoscopy on 03/03/21? She had question in regards to pre-screening COVID testing as well - advised that we are no longer requiring pre-screening COVID test at this time  Please advise, thanks.

## 2021-02-26 NOTE — Telephone Encounter (Signed)
Lm on mobile number for patient to return call.

## 2021-02-26 NOTE — Telephone Encounter (Signed)
Agree she should touch base with her PCP to make sure she is not having a UTI or renal stone, etc. Up to her if she wants to proceed on 03/03/21, depends on how she feels. If she wants to address this acute concern now and reschedule that is okay, she just needs to let us know soon so we can fill the spot with another patient who is waiting. Thanks

## 2021-02-27 NOTE — Telephone Encounter (Signed)
Spoke with patient in regards to further recommendations. She states that the pain has subsided, possible blood in urine because it was "dark". Patient will see PCP tomorrow, she still would like to proceed with colonoscopy as well. Patient had no concerns at the end of the call.

## 2021-02-28 ENCOUNTER — Other Ambulatory Visit: Payer: Self-pay

## 2021-02-28 ENCOUNTER — Encounter: Payer: Self-pay | Admitting: Family Medicine

## 2021-02-28 ENCOUNTER — Ambulatory Visit: Payer: BC Managed Care – PPO | Admitting: Family Medicine

## 2021-02-28 VITALS — BP 178/73 | HR 64 | Temp 96.6°F | Wt 183.4 lb

## 2021-02-28 DIAGNOSIS — R300 Dysuria: Secondary | ICD-10-CM | POA: Diagnosis not present

## 2021-02-28 DIAGNOSIS — R319 Hematuria, unspecified: Secondary | ICD-10-CM | POA: Diagnosis not present

## 2021-02-28 LAB — POCT URINALYSIS DIPSTICK
Spec Grav, UA: 1.02 (ref 1.010–1.025)
pH, UA: 6 (ref 5.0–8.0)

## 2021-02-28 NOTE — Patient Instructions (Signed)

## 2021-02-28 NOTE — Progress Notes (Signed)
Pt began having right side low back pain on and off for a couple of months. Radiates to side and groin area. Tuesday began burning. Pt has had a regular stream with dark color urine. Pt states " I have not had a good stream in a good while". Pt states she did not have any pain after Wednesday. Pt has issues with IBS and diarrhea/loose stool. Has had issue with constipation with this issue.   Pt has colonoscopy scheduled for Monday    Patient ID: Ashlee Carpenter, female    DOB: 10-15-1961, 60 y.o.   MRN: [id number]   Chief Complaint  Patient presents with  . Back Pain   Subjective:  CC: back pain with difficult urine stream off and on  This is a new problem.  Presents today with a complaint of possible kidney stone.  Reports that she has had urinary burning and pain in the back for the past 2 months off and on.  Also reports off-and-on pain in the right back that it also is in the abdominal area.  Does not have a history of kidney stones, reports that her mother has a history.  Reports that she has seen no obvious blood in her urine, her urine is dark in color.  Pain is resolved today, reports that when she does not have pain her urine stream is stronger.  Denies fever, chest pain, shortness of breath.  Endorses chills, abdominal distention, abdominal pain, constipation, and nausea.  Also has a history of IBS.    Medical History Ashlee Carpenter has a past medical history of Allergy, DDD (degenerative disc disease), cervical, Degenerative disc disease, cervical, Degenerative lumbar disc, Diarrhea, Gastric hypoacidity, Gastritis, GERD (gastroesophageal reflux disease), IBS (irritable bowel syndrome), Psoriasis, and Seizures ([place]).   Outpatient Encounter Medications as of 02/28/2021  Medication Sig  . OIL OF OREGANO PO Take by mouth. Occ use  . OVER THE COUNTER MEDICATION Take 1 tablet by mouth 3 (three) times daily with meals. Hydrochloric acid prozyme as needed for digestion  . OVER THE COUNTER  MEDICATION Tumeric curcumin with bioperine 1200mg  daily  Vitamin d3 50 mcg  Vitamin c with calcium and magnesium 500mg   pinellia and magnolia combination herb 600 - 1200mg    Omega 3 2,000 mg  Glucofit 1334 mg  . Probiotic Product ([employer] DIGESTIVE PROBIOTIC PO) Take 1 tablet by mouth at bedtime.  . Sodium Sulfate-Mag Sulfate-KCl (SUTAB) [phone] MG TABS Take 24 tablets by mouth as directed. MANUFACTURER CODES!! BIN: [id number] PCN: CN GROUP: [id number] MEMBER ID: [id number];[employer] AS SECONDARY INSURANCE ;NO PRIOR AUTHORIZATION  . VITAMIN D-VITAMIN K PO Take by mouth. Takes this about 3 x a week -- Vitamin D 3 and K2   No facility-administered encounter medications on file as of 02/28/2021.     Review of Systems  Constitutional: Positive for chills. Negative for fever.       Also has chills with IBS flare.   Respiratory: Negative for cough and shortness of breath.   Cardiovascular: Negative for chest pain.  Gastrointestinal: Positive for abdominal distention, abdominal pain, constipation and nausea. Negative for vomiting.  Genitourinary: Positive for decreased urine volume, difficulty urinating, dysuria and frequency. Negative for hematuria and urgency.       Not a good urine stream past 2 months associated with back pain.   Musculoskeletal: Positive for back pain.  Neurological: Negative for headaches.     Vitals BP (!) 178/73   Pulse 64   Temp (!) 96.6 F (35.9 C)  Wt 183 lb 6.4 oz (83.2 kg)   SpO2 99%   BMI 30.52 kg/m   Objective:   Physical Exam Vitals reviewed.  Constitutional:      Appearance: Normal appearance.  Cardiovascular:     Rate and Rhythm: Normal rate and regular rhythm.     Heart sounds: Normal heart sounds.  Pulmonary:     Effort: Pulmonary effort is normal.     Breath sounds: Normal breath sounds.  Abdominal:     General: Bowel sounds are normal.     Tenderness: There is no right CVA tenderness or left CVA tenderness.  Skin:    General: Skin is  warm and dry.  Neurological:     General: No focal deficit present.     Mental Status: She is alert.  Psychiatric:        Behavior: Behavior normal.    Results for orders placed or performed in visit on 02/28/21  POCT Urinalysis Dipstick  Result Value Ref Range   Color, UA     Clarity, UA     Glucose, UA     Bilirubin, UA     Ketones, UA     Spec Grav, UA 1.020 1.010 - 1.025   Blood, UA +    pH, UA 6.0 5.0 - 8.0   Protein, UA     Urobilinogen, UA     Nitrite, UA     Leukocytes, UA     Appearance     Odor       Assessment and Plan   1. Burning with urination - POCT Urinalysis Dipstick  2. Hematuria, unspecified type - Basic Metabolic Panel (BMET) - Urinalysis, Routine w reflex microscopic - Urine Culture   No pain today, hematuria present in urine dip, no evidence of infection.  Will send for culture..  Will get basic metabolic panel to assess kidney function, and urinalysis.  Unable to tolerate NSAIDs, due to GI issues, she is scheduled for colonoscopy on Monday.  She will take ibuprofen 200 mg occasionally if the pain returns.  Agrees with plan of care discussed today. Understands warning signs to seek further care: chest pain, shortness of breath, any significant change in health.  Understands to follow-up in 2 weeks to reassess hematuria, will notify once lab results are available.  If renal function is compromised, appropriate referral will be made at that time.  Warning signs discussed: High fever, chills, rigors, gross hematuria, abdominal/back pain, report to emergency room immediately.  Prescription given for urine strainer.   Chalmers Guest, NP 02/28/2021

## 2021-03-01 LAB — BASIC METABOLIC PANEL
BUN/Creatinine Ratio: 13 (ref 9–23)
BUN: 12 mg/dL (ref 6–24)
CO2: 22 mmol/L (ref 20–29)
Calcium: 9.5 mg/dL (ref 8.7–10.2)
Chloride: 103 mmol/L (ref 96–106)
Creatinine, Ser: 0.96 mg/dL (ref 0.57–1.00)
Glucose: 93 mg/dL (ref 65–99)
Potassium: 4.8 mmol/L (ref 3.5–5.2)
Sodium: 141 mmol/L (ref 134–144)
eGFR: 68 mL/min/{1.73_m2} (ref >59)

## 2021-03-01 LAB — URINALYSIS, ROUTINE W REFLEX MICROSCOPIC
Bilirubin, UA: NEGATIVE
Glucose, UA: NEGATIVE
Ketones, UA: NEGATIVE
Nitrite, UA: NEGATIVE
Protein,UA: NEGATIVE
Specific Gravity, UA: 1.013 (ref 1.005–1.030)
Urobilinogen, Ur: 0.2 mg/dL (ref 0.2–1.0)
pH, UA: 6 (ref 5.0–7.5)

## 2021-03-01 LAB — MICROSCOPIC EXAMINATION
Bacteria, UA: NONE SEEN
Casts: NONE SEEN /lpf

## 2021-03-03 ENCOUNTER — Ambulatory Visit (AMBULATORY_SURGERY_CENTER): Payer: BC Managed Care – PPO | Admitting: Gastroenterology

## 2021-03-03 ENCOUNTER — Other Ambulatory Visit: Payer: Self-pay

## 2021-03-03 ENCOUNTER — Encounter: Payer: Self-pay | Admitting: Gastroenterology

## 2021-03-03 ENCOUNTER — Other Ambulatory Visit: Payer: Self-pay | Admitting: Family Medicine

## 2021-03-03 VITALS — BP 121/58 | HR 64 | Temp 97.8°F | Resp 20 | Ht 65.0 in | Wt 190.0 lb

## 2021-03-03 DIAGNOSIS — D128 Benign neoplasm of rectum: Secondary | ICD-10-CM

## 2021-03-03 DIAGNOSIS — D123 Benign neoplasm of transverse colon: Secondary | ICD-10-CM

## 2021-03-03 DIAGNOSIS — N3001 Acute cystitis with hematuria: Secondary | ICD-10-CM

## 2021-03-03 DIAGNOSIS — D127 Benign neoplasm of rectosigmoid junction: Secondary | ICD-10-CM

## 2021-03-03 DIAGNOSIS — K529 Noninfective gastroenteritis and colitis, unspecified: Secondary | ICD-10-CM | POA: Diagnosis present

## 2021-03-03 DIAGNOSIS — D120 Benign neoplasm of cecum: Secondary | ICD-10-CM | POA: Diagnosis not present

## 2021-03-03 DIAGNOSIS — D122 Benign neoplasm of ascending colon: Secondary | ICD-10-CM

## 2021-03-03 DIAGNOSIS — K648 Other hemorrhoids: Secondary | ICD-10-CM | POA: Diagnosis not present

## 2021-03-03 DIAGNOSIS — K5730 Diverticulosis of large intestine without perforation or abscess without bleeding: Secondary | ICD-10-CM | POA: Diagnosis not present

## 2021-03-03 DIAGNOSIS — Z800 Family history of malignant neoplasm of digestive organs: Secondary | ICD-10-CM

## 2021-03-03 LAB — URINE CULTURE

## 2021-03-03 MED ORDER — SODIUM CHLORIDE 0.9 % IV SOLN: 500.0000 mL | Freq: Once | INTRAVENOUS | Status: DC

## 2021-03-03 MED ORDER — NITROFURANTOIN MONOHYD MACRO 100 MG PO CAPS: 100.0000 mg | ORAL_CAPSULE | Freq: Two times a day (BID) | ORAL | 0 refills | Status: DC

## 2021-03-03 NOTE — Patient Instructions (Signed)
YOU HAD AN ENDOSCOPIC PROCEDURE TODAY AT THE [facility]:   Refer to the procedure report that was given to you for any specific questions about what was found during the examination.  If the procedure report does not answer your questions, please call your gastroenterologist to clarify.  If you requested that your care partner not be given the details of your procedure findings, then the procedure report has been included in a sealed envelope for you to review at your convenience later.  YOU SHOULD EXPECT: Some feelings of bloating in the abdomen. Passage of more gas than usual.  Walking can help get rid of the air that was put into your GI tract during the procedure and reduce the bloating. If you had a lower endoscopy (such as a colonoscopy or flexible sigmoidoscopy) you may notice spotting of blood in your stool or on the toilet paper. If you underwent a bowel prep for your procedure, you may not have a normal bowel movement for a few days.  Please Note:  You might notice some irritation and congestion in your nose or some drainage.  This is from the oxygen used during your procedure.  There is no need for concern and it should clear up in a day or so.  SYMPTOMS TO REPORT IMMEDIATELY:   Following lower endoscopy (colonoscopy or flexible sigmoidoscopy):  Excessive amounts of blood in the stool  Significant tenderness or worsening of abdominal pains  Swelling of the abdomen that is new, acute  Fever of 100F or higher   Following upper endoscopy (EGD)  Vomiting of blood or coffee ground material  New chest pain or pain under the shoulder blades  Painful or persistently difficult swallowing  New shortness of breath  Fever of 100F or higher  Black, tarry-looking stools  For urgent or emergent issues, a gastroenterologist can be reached at any hour by calling [phone]. Do not use MyChart messaging for urgent concerns.    DIET:  We do recommend a small meal at first, but  then you may proceed to your regular diet.  Drink plenty of fluids but you should avoid alcoholic beverages for 24 hours.  ACTIVITY:  You should plan to take it easy for the rest of today and you should NOT DRIVE or use heavy machinery until tomorrow (because of the sedation medicines used during the test).    FOLLOW UP: Our staff will call the number listed on your records 48-72 hours following your procedure to check on you and address any questions or concerns that you may have regarding the information given to you following your procedure. If we do not reach you, we will leave a message.  We will attempt to reach you two times.  During this call, we will ask if you have developed any symptoms of COVID 19. If you develop any symptoms (ie: fever, flu-like symptoms, shortness of breath, cough etc.) before then, please call [phone].  If you test positive for Covid 19 in the 2 weeks post procedure, please call and report this information to us.    If any biopsies were taken you will be contacted by phone or by letter within the next 1-3 weeks.  Please call us at [phone] if you have not heard about the biopsies in 3 weeks.    SIGNATURES/CONFIDENTIALITY: You and/or your care partner have signed paperwork which will be entered into your electronic medical record.  These signatures attest to the fact that that the information above on   your After Visit Summary has been reviewed and is understood.  Full responsibility of the confidentiality of this discharge information lies with you and/or your care-partner. 

## 2021-03-03 NOTE — Progress Notes (Signed)
Called to room to assist during endoscopic procedure.  Patient ID and intended procedure confirmed with present staff. Received instructions for my participation in the procedure from the performing physician.  

## 2021-03-03 NOTE — Progress Notes (Signed)
Antibiotic ordered

## 2021-03-03 NOTE — Progress Notes (Signed)
pt tolerated well. VSS. awake and to recovery. Report given to RN.  

## 2021-03-03 NOTE — Op Note (Signed)
[facility] Patient Name: Ashlee Carpenter Procedure Date: 03/03/2021 1:26 PM MRN: [id number] Endoscopist: Remo Lipps P. Havery Moros , MD Age: 60 Referring MD:  Date of Birth: Sep 06, 1961 Gender: Female Account #: [id number] Procedure:                Colonoscopy Indications:              Screening in patient at increased risk: Family                            history of 1st-degree relative with colorectal                            cancer (mother dx age 61s), patient incidentally                            endorses chronic diarrhea Medicines:                Monitored Anesthesia Care Procedure:                Pre-Anesthesia Assessment:                           - Prior to the procedure, a History and Physical                            was performed, and patient medications and                            allergies were reviewed. The patient's tolerance of                            previous anesthesia was also reviewed. The risks                            and benefits of the procedure and the sedation                            options and risks were discussed with the patient.                            All questions were answered, and informed consent                            was obtained. Prior Anticoagulants: The patient has                            taken no previous anticoagulant or antiplatelet                            agents. ASA Grade Assessment: II - A patient with                            mild systemic disease. After reviewing the risks  and benefits, the patient was deemed in                            satisfactory condition to undergo the procedure.                           After obtaining informed consent, the colonoscope                            was passed under direct vision. Throughout the                            procedure, the patient's blood pressure, pulse, and                            oxygen saturations were monitored  continuously. The                            Olympus CF-HQ190L [id number] [id number]) Colonoscope was                            introduced through the anus and advanced to the the                            terminal ileum, with identification of the                            appendiceal orifice and IC valve. The colonoscopy                            was performed without difficulty. The patient                            tolerated the procedure well. The quality of the                            bowel preparation was good. The terminal ileum,                            ileocecal valve, appendiceal orifice, and rectum                            were photographed. Scope In: 1:29:00 PM Scope Out: 1:51:30 PM Scope Withdrawal Time: 0 hours 19 minutes 36 seconds  Total Procedure Duration: 0 hours 22 minutes 30 seconds  Findings:                 The perianal and digital rectal examinations were                            normal.                           The terminal ileum appeared normal.  A 3 mm polyp was found in the cecum. The polyp was                            sessile. The polyp was removed with a cold snare.                            Resection and retrieval were complete.                           A 12 to 15 mm polyp was found in the ascending                            colon. The polyp was sessile. The polyp was removed                            with a cold snare. Resection and retrieval were                            complete.                           A 4 mm polyp was found in the transverse colon. The                            polyp was flat. The polyp was removed with a cold                            snare. Resection and retrieval were complete.                           A 3 mm polyp was found in the recto-sigmoid colon.                            The polyp was sessile. The polyp was removed with a                            cold snare. Resection and retrieval  were complete.                           A 3 mm polyp was found in the rectum. The polyp was                            sessile. The polyp was removed with a cold snare.                            Resection and retrieval were complete.                           Multiple small-mouthed diverticula were found in                            the sigmoid colon.  Internal hemorrhoids were found during retroflexion.                           The exam was otherwise without abnormality.                           Biopsies for histology were taken with a cold                            forceps from the right colon, left colon and                            transverse colon for evaluation of microscopic                            colitis. Complications:            No immediate complications. Estimated blood loss:                            Minimal. Estimated Blood Loss:     Estimated blood loss was minimal. Impression:               - The examined portion of the ileum was normal.                           - One 3 mm polyp in the cecum, removed with a cold                            snare. Resected and retrieved.                           - One 12 to 15 mm polyp in the ascending colon,                            removed with a cold snare. Resected and retrieved.                           - One 4 mm polyp in the transverse colon, removed                            with a cold snare. Resected and retrieved.                           - One 3 mm polyp at the recto-sigmoid colon,                            removed with a cold snare. Resected and retrieved.                           - One 3 mm polyp in the rectum, removed with a cold                            snare. Resected and retrieved.                           -  Diverticulosis in the sigmoid colon.                           - Internal hemorrhoids.                           - The examination was otherwise normal.                            - Biopsies were taken with a cold forceps from the                            right colon, left colon and transverse colon for                            evaluation of microscopic colitis. Recommendation:           - Patient has a contact number available for                            emergencies. The signs and symptoms of potential                            delayed complications were discussed with the                            patient. Return to normal activities tomorrow.                            Written discharge instructions were provided to the                            patient.                           - Resume previous diet.                           - Continue present medications.                           - Await pathology results. Remo Lipps P. Kaianna Dolezal, MD 03/03/2021 1:57:12 PM This report has been signed electronically.

## 2021-03-05 ENCOUNTER — Telehealth: Payer: Self-pay

## 2021-03-05 NOTE — Telephone Encounter (Signed)
  Follow up Call-  Call back number 03/03/2021  Post procedure Call Back phone  # [phone]  Permission to leave phone message Yes  Some recent data might be hidden     Patient questions:  Do you have a fever, pain , or abdominal swelling? No. Pain Score  0 *  Have you tolerated food without any problems? Yes.    Have you been able to return to your normal activities? Yes.    Do you have any questions about your discharge instructions: Diet   No. Medications  No. Follow up visit  No.  Do you have questions or concerns about your Care? No.  Actions: * If pain score is 4 or above: No action needed, pain <4.

## 2021-03-10 ENCOUNTER — Encounter: Payer: Self-pay | Admitting: Family Medicine

## 2021-03-10 NOTE — Telephone Encounter (Signed)
Please stop taking antibiotic if you are having side effects. If urinary symptoms continue- can come back in and we can recheck urine.  Sorry you are not tolerating the antibiotic.  KD

## 2021-03-12 ENCOUNTER — Encounter: Payer: Self-pay | Admitting: Family Medicine

## 2021-03-17 ENCOUNTER — Ambulatory Visit: Payer: BC Managed Care – PPO | Admitting: Family Medicine

## 2021-03-17 ENCOUNTER — Encounter: Payer: Self-pay | Admitting: Family Medicine

## 2021-03-17 ENCOUNTER — Other Ambulatory Visit: Payer: Self-pay

## 2021-03-17 VITALS — BP 160/93 | HR 78 | Temp 97.1°F | Wt 184.4 lb

## 2021-03-17 DIAGNOSIS — R319 Hematuria, unspecified: Secondary | ICD-10-CM | POA: Diagnosis not present

## 2021-03-17 DIAGNOSIS — R198 Other specified symptoms and signs involving the digestive system and abdomen: Secondary | ICD-10-CM | POA: Insufficient documentation

## 2021-03-17 LAB — POCT URINALYSIS DIPSTICK
Spec Grav, UA: 1.015 (ref 1.010–1.025)
pH, UA: 6 (ref 5.0–8.0)

## 2021-03-17 NOTE — Patient Instructions (Signed)

## 2021-03-17 NOTE — Progress Notes (Signed)
Pt here for follow up on hematuria. Pt states she has had 2 bouts of pain. Pt was unable to take antibiotic due to palpitations. Pt did take 12 of 20 Macrobid 100 mg. Pt would also like a pelvic exam. Pt states "she is not sure what a 60 year old vagina should look like but it looks like something is falling out.".    Patient ID: Ashlee Carpenter, female    DOB: 1961/01/21, 60 y.o.   MRN: [id number]   Chief Complaint  Patient presents with  . Hematuria   Subjective:  CC: follow-up for hematuria        This is a new problem.  Presents today to follow-up for hematuria and vaginal pain, abdominal fullness.  Patient initially seen on April 1, had some leukocytes in her urine, treated with Macrobid, did not tolerate Macrobid took 12 out of the 20 tablets.  Recently had colonoscopy, 5 polyps removed.  Wishes to have pelvic exam done today.    Medical History Ashlee Carpenter has a past medical history of Allergy, DDD (degenerative disc disease), cervical, Degenerative disc disease, cervical, Degenerative lumbar disc, Diarrhea, Gastric hypoacidity, Gastritis, GERD (gastroesophageal reflux disease), IBS (irritable bowel syndrome), Psoriasis, and Seizures ([place]).   Outpatient Encounter Medications as of 03/17/2021  Medication Sig  . ibuprofen (ADVIL) 200 MG tablet Take 200 mg by mouth every 6 (six) hours as needed.  . OIL OF OREGANO PO Take by mouth. Occ use  . OVER THE COUNTER MEDICATION Take 1 tablet by mouth 3 (three) times daily with meals. Hydrochloric acid prozyme as needed for digestion  . OVER THE COUNTER MEDICATION Tumeric curcumin with bioperine 1200mg  daily  Vitamin d3 50 mcg  Vitamin c with calcium and magnesium 500mg   pinellia and magnolia combination herb 600 - 1200mg    Omega 3 2,000 mg  Glucofit 1334 mg  . Probiotic Product ([employer] DIGESTIVE PROBIOTIC PO) Take 1 tablet by mouth at bedtime.  Marland Kitchen VITAMIN D-VITAMIN K PO Take by mouth. Takes this about 3 x a week -- Vitamin D 3 and K2  .  [DISCONTINUED] nitrofurantoin, macrocrystal-monohydrate, (MACROBID) 100 MG capsule Take 1 capsule (100 mg total) by mouth 2 (two) times daily.  . [DISCONTINUED] Sodium Sulfate-Mag Sulfate-KCl (SUTAB) [phone] MG TABS Take 24 tablets by mouth as directed. MANUFACTURER CODES!! BIN: [id number] PCN: CN GROUP: [id number] MEMBER ID: [id number];[employer] AS SECONDARY INSURANCE ;NO PRIOR AUTHORIZATION   No facility-administered encounter medications on file as of 03/17/2021.     Review of Systems  Constitutional: Negative for chills and fever.  Respiratory: Negative for shortness of breath.   Cardiovascular: Positive for palpitations. Negative for chest pain.       Related to [place], has gotten better since he stopped Macrobid.  Genitourinary: Positive for flank pain, hematuria, pelvic pain and vaginal pain. Negative for dysuria, vaginal bleeding and vaginal discharge.     Vitals BP (!) 160/93   Pulse 78   Temp (!) 97.1 F (36.2 C)   Wt 184 lb 6.4 oz (83.6 kg)   SpO2 100%   BMI 30.69 kg/m   Objective:   Physical Exam Vitals reviewed.  Constitutional:      Appearance: Normal appearance.  Cardiovascular:     Rate and Rhythm: Normal rate and regular rhythm.     Heart sounds: Normal heart sounds.  Pulmonary:     Effort: Pulmonary effort is normal.     Breath sounds: Normal breath sounds.  Abdominal:     General: Bowel sounds are normal.  Genitourinary:    Comments: No evidence of uterine prolapse. Skin:    General: Skin is warm and dry.  Neurological:     General: No focal deficit present.     Mental Status: She is alert.  Psychiatric:        Behavior: Behavior normal.   External GU: no rashes or lesions. Vagina: no discharge; tissue pink in color. Cervix: normal appearance. No CMT. Bimanual exam: no tenderness or obvious masses.    Results for orders placed or performed in visit on 03/17/21  POCT urinalysis dipstick  Result Value Ref Range   Color, UA     Clarity, UA      Glucose, UA     Bilirubin, UA     Ketones, UA     Spec Grav, UA 1.015 1.010 - 1.025   Blood, UA trace    pH, UA 6.0 5.0 - 8.0   Protein, UA     Urobilinogen, UA     Nitrite, UA     Leukocytes, UA     Appearance     Odor      Assessment and Plan   1. Hematuria, unspecified type - POCT urinalysis dipstick - Ambulatory referral to Urology  2. Generalized abdominal fullness - Ambulatory referral to Gynecology   Pelvic exam done, no uterine prolapse observed.  Will send to GYN for feelings of abdominal fullness.  Patient is a smoker, will send to urology for persistent hematuria.  Has had 2 episodes of flank pain, which have resolved spontaneously.  Instructed that if his flank pain returns, consider evaluation in the emergency department while pain is occurring.  Agrees with plan of care discussed today. Understands warning signs to seek further care: chest pain, shortness of breath, any significant change in health.  Understands to follow-up with urology and GYN, referral was placed today.    Chalmers Guest, NP 03/17/2021

## 2021-04-05 ENCOUNTER — Emergency Department (HOSPITAL_COMMUNITY): Payer: BC Managed Care – PPO

## 2021-04-05 ENCOUNTER — Emergency Department (HOSPITAL_COMMUNITY)
Admission: EM | Admit: 2021-04-05 | Discharge: 2021-04-06 | Disposition: A | Discharge status: Home / Self Care | Payer: BC Managed Care – PPO | Attending: Emergency Medicine | Admitting: Emergency Medicine

## 2021-04-05 ENCOUNTER — Other Ambulatory Visit: Payer: Self-pay

## 2021-04-05 ENCOUNTER — Encounter (HOSPITAL_COMMUNITY): Payer: Self-pay | Admitting: Emergency Medicine

## 2021-04-05 DIAGNOSIS — N130 Hydronephrosis with ureteropelvic junction obstruction: Secondary | ICD-10-CM | POA: Insufficient documentation

## 2021-04-05 DIAGNOSIS — F17210 Nicotine dependence, cigarettes, uncomplicated: Secondary | ICD-10-CM | POA: Diagnosis not present

## 2021-04-05 DIAGNOSIS — R109 Unspecified abdominal pain: Secondary | ICD-10-CM | POA: Diagnosis present

## 2021-04-05 DIAGNOSIS — K219 Gastro-esophageal reflux disease without esophagitis: Secondary | ICD-10-CM | POA: Insufficient documentation

## 2021-04-05 DIAGNOSIS — N200 Calculus of kidney: Secondary | ICD-10-CM

## 2021-04-05 MED ORDER — KETOROLAC TROMETHAMINE 30 MG/ML IJ SOLN
30.0000 mg | Freq: Once | INTRAMUSCULAR | Status: AC
  Administered 2021-04-05: 30 mg via INTRAVENOUS
  Filled 2021-04-05: qty 1

## 2021-04-05 MED ORDER — MORPHINE SULFATE (PF) 4 MG/ML IV SOLN
4.0000 mg | Freq: Once | INTRAVENOUS | Status: AC
  Administered 2021-04-05: 4 mg via INTRAVENOUS
  Filled 2021-04-05: qty 1

## 2021-04-05 MED ORDER — ONDANSETRON HCL 4 MG/2ML IJ SOLN
4.0000 mg | Freq: Once | INTRAMUSCULAR | Status: AC
  Administered 2021-04-05: 4 mg via INTRAVENOUS
  Filled 2021-04-05: qty 2

## 2021-04-05 NOTE — ED Triage Notes (Signed)
Pt states she is having R sided flank pain, bloating, and dribbling of urine.

## 2021-04-05 NOTE — ED Provider Notes (Signed)
[facility] EMERGENCY DEPARTMENT Provider Note   CSN: [id number] Arrival date & time: 04/05/21  2312     History Chief Complaint  Patient presents with  . Flank Pain    Ashlee Carpenter is a 60 y.o. female.  Patient is a 60 year old female with history of degenerative disc disease, GERD, irritable bowel.  Patient has had right flank pain occurring intermittently for several weeks.  She has been seen by her primary doctor.  She tells me that her urine samples show microscopic blood, but no infection.  It has been presumed that she has kidney stones, however she has not had any imaging performed.  She tells me she has an appointment upcoming in June with urology.  Pain became worse this evening and presents for evaluation of it.  She denies any fevers or chills.  She denies any diarrhea or constipation.  She has tried ibuprofen with relief in the past, however does not seem to be helping tonight.  The history is provided by the patient.  Flank Pain This is a new problem. Episode onset: Several weeks ago. The problem occurs constantly. The problem has been gradually worsening. Associated symptoms include abdominal pain. Nothing aggravates the symptoms. Nothing relieves the symptoms.       Past Medical History:  Diagnosis Date  . Allergy   . DDD (degenerative disc disease), cervical    spurs in neck area  . Degenerative disc disease, cervical   . Degenerative lumbar disc   . Diarrhea    with rectal spasms   . Gastric hypoacidity   . Gastritis   . GERD (gastroesophageal reflux disease)   . IBS (irritable bowel syndrome)   . Psoriasis   . 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 Generalized abdominal fullness 03/17/2021  . Hematuria 02/28/2021  . Burning with urination 02/28/2021   . Tobacco use 03/23/2020  . Acute cholecystitis 11/05/2016  . Gastroesophageal reflux disease with esophagitis 06/25/2015  . Gastritis 06/25/2015    Past Surgical History:  Procedure Laterality Date  . CHOLECYSTECTOMY N/A 11/06/2016   Procedure: LAPAROSCOPIC CHOLECYSTECTOMY;  Surgeon: Aviva Signs, MD;  Location: [facility];  Service: General;  Laterality: N/A;  . COLONOSCOPY     last 2001 Hayden Rasmussen- normal with spastic colon   . TONSILLECTOMY       OB History   No obstetric history on file.     Family History  Problem Relation Age of Onset  . Colon cancer Mother 41  . Colon cancer Maternal Grandmother        died 47 from colon cancer   . Breast cancer Paternal Aunt   . Colon polyps Neg Hx   . Esophageal cancer Neg Hx   . Rectal cancer Neg Hx   . Stomach cancer Neg Hx     Social History   Tobacco Use  . Smoking status: Current Every Day Smoker    Packs/day: 0.50    Types: Cigarettes  . Smokeless tobacco: Never Used  Substance Use Topics  . Alcohol use: No    Alcohol/week: 0.0 standard drinks  . Drug use: No    Home Medications Prior to Admission medications   Medication Sig Start Date End Date Taking? Authorizing Provider  ibuprofen (ADVIL) 200 MG tablet Take 200 mg by mouth every 6 (six) hours as needed.    [provider]  OIL OF OREGANO PO Take by mouth. Occ use    [provider]  OVER THE COUNTER MEDICATION Take 1 tablet by mouth 3 (three) times daily with meals. Hydrochloric acid prozyme as needed for digestion    [provider]  OVER THE COUNTER MEDICATION Tumeric curcumin with bioperine 1200mg  daily  Vitamin d3 50 mcg  Vitamin c with calcium and magnesium 500mg   pinellia and magnolia combination herb 600 - 1200mg    Omega 3 2,000 mg  Glucofit 1334 mg    [provider]  Probiotic Product ([employer] DIGESTIVE PROBIOTIC PO) Take 1 tablet by mouth at bedtime.    [provider]  VITAMIN D-VITAMIN K PO Take by  mouth. Takes this about 3 x a week -- Vitamin D 3 and K2    [provider]    Allergies    Gluten meal and Other  Review of Systems   Review of Systems  Gastrointestinal: Positive for abdominal pain.  Genitourinary: Positive for flank pain.  All other systems reviewed and are negative.   Physical Exam Updated Vital Signs BP (!) 171/73 (BP Location: Left Arm)   Temp 98.1 F (36.7 C) (Oral)   Resp 18   Ht 5\' 5"  (1.651 m)   Wt 84 kg   BMI 30.82 kg/m   Physical Exam Vitals and nursing note reviewed.  Constitutional:      General: She is not in acute distress.    Appearance: She is well-developed. She is not diaphoretic.  HENT:     Head: Normocephalic and atraumatic.  Cardiovascular:     Rate and Rhythm: Normal rate and regular rhythm.     Heart sounds: No murmur heard. No friction rub. No gallop.   Pulmonary:     Effort: Pulmonary effort is normal. No respiratory distress.     Breath sounds: Normal breath sounds. No wheezing.  Abdominal:     General: Bowel sounds are normal. There is no distension.     Palpations: Abdomen is soft.     Tenderness: There is abdominal tenderness. There is right CVA tenderness. There is no guarding or rebound.     Comments: There is tenderness to palpation in the right lower quadrant.  Musculoskeletal:        General: Normal range of motion.     Cervical back: Normal range of motion and neck supple.  Skin:    General: Skin is warm and dry.  Neurological:     Mental Status: She is alert and oriented to person, place, and time.     ED Results / Procedures / Treatments   Labs (all labs ordered are listed, but only abnormal results are displayed) Labs Reviewed  URINALYSIS, ROUTINE W REFLEX MICROSCOPIC  BASIC METABOLIC PANEL  CBC WITH DIFFERENTIAL/PLATELET    EKG None  Radiology No results found.  Procedures Procedures   Medications Ordered in ED Medications  morphine 4 MG/ML injection 4 mg (has no administration  in time range)  ketorolac (TORADOL) 30 MG/ML injection 30 mg (has no administration in time range)  ondansetron (ZOFRAN) injection 4 mg (has no administration in time range)    ED Course  I have reviewed the triage vital signs and the nursing notes.  Pertinent labs & imaging results that were available during my care of the patient were reviewed by me and considered in my medical  decision making (see chart for details).    MDM Rules/Calculators/A&P  CT scan shows a 7 mm stone at the right UVJ with obstruction.  There is trace perivesicular fat stranding, but no evidence for infection in the urine.  She is afebrile with no white count and UA is clear.  Laboratory studies show normal renal function.  Patient feeling much improved after receiving morphine, Toradol, and Zofran.  She will be discharged with Flomax and Percocet.  Referral will be placed to Dr. Alyson Ingles from urology.  She has an appointment upcoming in June, however I feel this should be expedited and hopefully she can be seen in early this week.  Final Clinical Impression(s) / ED Diagnoses Final diagnoses:  None    Rx / DC Orders ED Discharge Orders    None       Veryl Speak, MD 04/06/21 [phone]

## 2021-04-06 LAB — CBC WITH DIFFERENTIAL/PLATELET
Abs Immature Granulocytes: 0.05 10*3/uL (ref 0.00–0.07)
Basophils Absolute: 0.1 10*3/uL (ref 0.0–0.1)
Basophils Relative: 1 %
Eosinophils Absolute: 0.2 10*3/uL (ref 0.0–0.5)
Eosinophils Relative: 2 %
HCT: 42.9 % (ref 36.0–46.0)
Hemoglobin: 13.7 g/dL (ref 12.0–15.0)
Immature Granulocytes: 1 %
Lymphocytes Relative: 20 %
Lymphs Abs: 2 10*3/uL (ref 0.7–4.0)
MCH: 29.2 pg (ref 26.0–34.0)
MCHC: 31.9 g/dL (ref 30.0–36.0)
MCV: 91.5 fL (ref 80.0–100.0)
Monocytes Absolute: 0.8 10*3/uL (ref 0.1–1.0)
Monocytes Relative: 8 %
Neutro Abs: 6.9 10*3/uL (ref 1.7–7.7)
Neutrophils Relative %: 68 %
Platelets: 227 10*3/uL (ref 150–400)
RBC: 4.69 MIL/uL (ref 3.87–5.11)
RDW: 13.8 % (ref 11.5–15.5)
WBC: 10.1 10*3/uL (ref 4.0–10.5)
nRBC: 0 % (ref 0.0–0.2)

## 2021-04-06 LAB — BASIC METABOLIC PANEL
Anion gap: 8 (ref 5–15)
BUN: 19 mg/dL (ref 6–20)
CO2: 25 mmol/L (ref 22–32)
Calcium: 9.3 mg/dL (ref 8.9–10.3)
Chloride: 104 mmol/L (ref 98–111)
Creatinine, Ser: 0.83 mg/dL (ref 0.44–1.00)
GFR, Estimated: >60 mL/min (ref >60)
Glucose, Bld: 110 mg/dL — ABNORMAL HIGH (ref 70–99)
Potassium: 4 mmol/L (ref 3.5–5.1)
Sodium: 137 mmol/L (ref 135–145)

## 2021-04-06 LAB — URINALYSIS, ROUTINE W REFLEX MICROSCOPIC
Bilirubin Urine: NEGATIVE
Glucose, UA: NEGATIVE mg/dL
Ketones, ur: NEGATIVE mg/dL
Leukocytes,Ua: NEGATIVE
Nitrite: NEGATIVE
Protein, ur: NEGATIVE mg/dL
Specific Gravity, Urine: >1.03 — ABNORMAL HIGH (ref 1.005–1.030)
pH: 5.5 (ref 5.0–8.0)

## 2021-04-06 LAB — URINALYSIS, MICROSCOPIC (REFLEX): Bacteria, UA: NONE SEEN

## 2021-04-06 MED ORDER — OXYCODONE-ACETAMINOPHEN 5-325 MG PO TABS: 2.0000 | ORAL_TABLET | ORAL | 0 refills | Status: DC | PRN

## 2021-04-06 MED ORDER — OXYCODONE-ACETAMINOPHEN 5-325 MG PO TABS: 1.0000 | ORAL_TABLET | Freq: Four times a day (QID) | ORAL | 0 refills | Status: DC | PRN

## 2021-04-06 MED ORDER — TAMSULOSIN HCL 0.4 MG PO CAPS: 0.4000 mg | ORAL_CAPSULE | Freq: Every day | ORAL | 0 refills | Status: DC

## 2021-04-06 NOTE — Discharge Instructions (Signed)
Begin taking Flomax as prescribed.  Begin taking Percocet as prescribed as needed for pain.  Follow-up with Dr. Alyson Ingles in the next 2 to 3 days.  His contact information has been provided in this discharge summary for you to call and make these arrangements.  Return to the emergency department if you develop worsening pain, high fever, or other new and concerning symptoms.

## 2021-04-06 NOTE — ED Notes (Signed)
Patient transported to CT 

## 2021-04-07 MED FILL — Oxycodone w/ Acetaminophen Tab 5-325 MG: ORAL | Qty: 6 | Status: AC

## 2021-04-08 ENCOUNTER — Encounter: Payer: BC Managed Care – PPO | Admitting: Adult Health

## 2021-05-13 ENCOUNTER — Ambulatory Visit: Payer: BC Managed Care – PPO | Admitting: Urology

## 2021-05-13 ENCOUNTER — Ambulatory Visit (HOSPITAL_COMMUNITY)
Admission: RE | Admit: 2021-05-13 | Discharge: 2021-05-13 | Disposition: A | Discharge status: Home / Self Care | Payer: BC Managed Care – PPO | Source: Ambulatory Visit | Attending: Urology | Admitting: Urology

## 2021-05-13 ENCOUNTER — Encounter: Payer: Self-pay | Admitting: Urology

## 2021-05-13 ENCOUNTER — Other Ambulatory Visit: Payer: Self-pay

## 2021-05-13 VITALS — BP 132/62 | HR 70

## 2021-05-13 DIAGNOSIS — N200 Calculus of kidney: Secondary | ICD-10-CM | POA: Diagnosis not present

## 2021-05-13 MED ORDER — TAMSULOSIN HCL 0.4 MG PO CAPS: 0.4000 mg | ORAL_CAPSULE | Freq: Every day | ORAL | 0 refills | Status: DC

## 2021-05-13 NOTE — Addendum Note (Signed)
Addended byIris Pert on: 05/13/2021 03:33 PM   Modules accepted: Orders

## 2021-05-13 NOTE — Progress Notes (Signed)
Urological Symptom Review  Patient is experiencing the following symptoms: Frequent urination Hard to postpone urination Burning/pain with urination Get up at night to urinate Leakage of urine Stream starts and stops Trouble starting stream Have to strain to urinate Blood in urine Urinary tract infection Weak stream Kidney stones   Review of Systems  Gastrointestinal (upper)  : Nausea Indigestion/heartburn  Gastrointestinal (lower) : Diarrhea Constipation  Constitutional : Fatigue  Skin: Skin rash/lesion Itching  Eyes: Blurred vision  Ear/Nose/Throat : Sinus Problems  Hematologic/Lymphatic: Negative for Hematologic/Lymphatic symptoms  Cardiovascular : Negative for cardiovascular symptoms  Respiratory : Negative for respiratory symptoms  Endocrine: Negative for endocrine symptoms  Musculoskeletal: Back pain Joint pain  Neurological: Headaches Dizziness  Psychologic: Negative for psychiatric symptoms

## 2021-05-13 NOTE — Patient Instructions (Signed)
[employer] (25th ed., pp. [phone]). Ashlee Carpenter: Ashlee Carpenter, [employer]. Retrieved from https://www.clinicalkey.com/#!/content/book/[email or web address]?[email or web address]">  Lithotripsy  Lithotripsy is a treatment that can help break up kidney stones that are too large to pass on their own. This is a nonsurgical procedure that crushes a kidney stone with shock waves. These shock waves pass through your body and focus on the kidney stone. They cause the kidney stone to break up into smaller pieces while it is still in the urinary tract. The smaller pieces of stone canpass more easily out of your body in the urine. Tell a health care provider about: Any allergies you have. All medicines you are taking, including vitamins, herbs, eye drops, creams, and over-the-counter medicines. Any problems you or family members have had with anesthetic medicines. Any blood disorders you have. Any surgeries you have had. Any medical conditions you have. Whether you are pregnant or may be pregnant. What are the risks? Generally, this is a safe procedure. However, problems may occur, including: Infection. Bleeding from the kidney. Bruising of the kidney or skin. Scarring of the kidney, which can lead to: Increased blood pressure. Poor kidney function. Return (recurrence) of kidney stones. Damage to other structures or organs, such as the liver, colon, spleen, or pancreas. Blockage (obstruction) of the tube that carries urine from the kidney to the bladder (ureter). Failure of the kidney stone to break into pieces (fragments). What happens before the procedure? Staying hydrated Follow instructions from your health care provider about hydration, which may include: Up to 2 hours before the procedure - you may continue to drink clear liquids, such as water, clear fruit juice, black coffee, and plain tea. Eating and drinking restrictions Follow instructions from your health care provider  about eating and drinking, which may include: 8 hours before the procedure - stop eating heavy meals or foods, such as meat, fried foods, or fatty foods. 6 hours before the procedure - stop eating light meals or foods, such as toast or cereal. 6 hours before the procedure - stop drinking milk or drinks that contain milk. 2 hours before the procedure - stop drinking clear liquids. Medicines Ask your health care provider about: Changing or stopping your regular medicines. This is especially important if you are taking diabetes medicines or blood thinners. Taking medicines such as aspirin and ibuprofen. These medicines can thin your blood. Do not take these medicines unless your health care provider tells you to take them. Taking over-the-counter medicines, vitamins, herbs, and supplements. Tests You may have tests, such as: Blood tests. Urine tests. Imaging tests, such as a CT scan. General instructions Plan to have someone take you home from the hospital or clinic. If you will be going home right after the procedure, plan to have someone with you for 24 hours. Ask your health care provider what steps will be taken to help prevent infection. These may include washing skin with a germ-killing soap. What happens during the procedure?  An IV will be inserted into one of your veins. You will be given one or more of the following: A medicine to help you relax (sedative). A medicine to make you fall asleep (general anesthetic). A water-filled cushion may be placed behind your kidney or on your abdomen. In some cases, you may be placed in a tub of lukewarm water. Your body will be positioned in a way that makes it easy to target the kidney stone. An X-ray or ultrasound exam will be done to locate your stone. Shock waves will be aimed  at the stone. If you are awake, you may feel a tapping sensation as the shock waves pass through your body. A flexible tube with holes in it (stent) may be placed in  the ureter. This will help keep urine flowing from the kidney if the fragments of the stone have been blocking the ureter. The procedure may vary among health care providers and hospitals. What happens after the procedure? You may have an X-ray to see whether the procedure was able to break up the kidney stone and how much of the stone has passed. If large stone fragments remain after treatment, you may need to have a second procedure at a later time. Your blood pressure, heart rate, breathing rate, and blood oxygen level will be monitored until you leave the hospital or clinic. You may be given antibiotics or pain medicine as needed. If a stent was placed in your ureter during surgery, it may stay in place for a few weeks. You may need to strain your urine to collect pieces of the kidney stone for testing. You will need to drink plenty of water. If you were given a sedative during the procedure, it can affect you for several hours. Do not drive or operate machinery until your health care provider says that it is safe. Summary Lithotripsy is a treatment that can help break up kidney stones that are too large to pass on their own. Lithotripsy is a nonsurgical procedure that crushes a kidney stone with shock waves. Generally, this is a safe procedure. However, problems may occur, including damage to the kidney or other organs, infection, or obstruction of the tube that carries urine from the kidney to the bladder (ureter). You may have a stent placed in your ureter to help drain your urine. This stent may stay in place for a few weeks. After the procedure, you will need to drink plenty of water. You may be asked to strain your urine to collect pieces of the kidney stone for testing. This information is not intended to replace advice given to you by your health care provider. Make sure you discuss any questions you have with your healthcare provider. Document Revised: 08/29/2019 Document Reviewed:  08/30/2019 Elsevier Patient Education  [employer].

## 2021-05-13 NOTE — Progress Notes (Addendum)
05/13/2021 1:46 PM   Johnathan Hausen 1961/03/09 [id number]  Referring provider: Chalmers Guest, NP [place],  [place] [id number]  Right flank pain   HPI: Ms Loomer is a 60yo here for evaluation of nephrolithiasis. Starting in February 2022 she was having intermittent right flank pain with associated gross hematuria. The pain became severe in 03/2021 and she presented to the ER. CT stone study 04/05/2021 showed a 57mm right distal ureteral calculus with moderate hydronephrosis. She has not passed her calculus. Last episode of flank pain was last week. She has intermittent nausea but no vomiting. She has been checked 3 times since February for a UTI and cultures have been negative. She has urinary frequency and urgency which are new and started 3 months ago.    PMH: Past Medical History:  Diagnosis Date   Acid reflux    Allergy    DDD (degenerative disc disease), cervical    spurs in neck area   Degenerative disc disease, cervical    Degenerative lumbar disc    Diarrhea    with rectal spasms    Gastric hypoacidity    Gastritis    GERD (gastroesophageal reflux disease)    IBS (irritable bowel syndrome)    Psoriasis    Seizures (HCC)    mini seizure in right eye with fragrance exposure -  gets dizzy- last 30 secs and gone- MRI in the past - not ever seen a neurologist - last episode 6-9 months ago     Surgical History: Past Surgical History:  Procedure Laterality Date   CHOLECYSTECTOMY N/A 11/06/2016   Procedure: LAPAROSCOPIC CHOLECYSTECTOMY;  Surgeon: Aviva Signs, MD;  Location: [facility];  Service: General;  Laterality: N/A;   COLONOSCOPY     last 2001 [facility] Rasmussen- normal with spastic colon    TONSILLECTOMY      Home Medications:  Allergies as of 05/13/2021       Reactions   Gluten Meal    Other    Chemicals, fragrances, gas fumes        Medication List        Accurate as of May 13, 2021  1:46 PM. If you have any questions, ask your nurse or  doctor.          [employer] DIGESTIVE PROBIOTIC PO Take 1 tablet by mouth at bedtime.   ibuprofen 200 MG tablet Commonly known as: ADVIL Take 200 mg by mouth every 6 (six) hours as needed.   OIL OF OREGANO PO Take by mouth. Occ use   OVER THE COUNTER MEDICATION Take 1 tablet by mouth 3 (three) times daily with meals. Hydrochloric acid prozyme as needed for digestion   OVER THE COUNTER MEDICATION Tumeric curcumin with bioperine 1200mg  daily  Vitamin d3 50 mcg  Vitamin c with calcium and magnesium 500mg   pinellia and magnolia combination herb 600 - 1200mg    Omega 3 2,000 mg  Glucofit 1334 mg   oxyCODONE-acetaminophen 5-325 MG tablet Commonly known as: Percocet Take 1-2 tablets by mouth every 6 (six) hours as needed.   oxyCODONE-acetaminophen 5-325 MG tablet Commonly known as: Percocet Take 2 tablets by mouth every 4 (four) hours as needed.   tamsulosin 0.4 MG Caps capsule Commonly known as: Flomax Take 1 capsule (0.4 mg total) by mouth daily.   VITAMIN D-VITAMIN K PO Take by mouth. Takes this about 3 x a week -- Vitamin D 3 and K2        Allergies:  Allergies  Allergen Reactions   Gluten Meal    Other     Chemicals, fragrances, gas fumes    Family History: Family History  Problem Relation Age of Onset   Colon cancer Mother 27   Colon cancer Maternal Grandmother        died 78 from colon cancer    Breast cancer Paternal Aunt    Colon polyps Neg Hx    Esophageal cancer Neg Hx    Rectal cancer Neg Hx    Stomach cancer Neg Hx     Social History:  reports that she has been smoking cigarettes. She has been smoking an average of 0.50 packs per day. She has never used smokeless tobacco. She reports that she does not drink alcohol and does not use drugs.  ROS: All other review of systems were reviewed and are negative except what is noted above in HPI  Physical Exam: BP 132/62   Pulse 70   Constitutional:  Alert and oriented, No acute distress. HEENT:  [place] AT, moist mucus membranes.  Trachea midline, no masses. Cardiovascular: No clubbing, cyanosis, or edema. Respiratory: Normal respiratory effort, no increased work of breathing. GI: Abdomen is soft, nontender, nondistended, no abdominal masses GU: No CVA tenderness.  Lymph: No cervical or inguinal lymphadenopathy. Skin: No rashes, bruises or suspicious lesions. Neurologic: Grossly intact, no focal deficits, moving all 4 extremities. Psychiatric: Normal mood and affect.  Laboratory Data: Lab Results  Component Value Date   WBC 10.1 04/06/2021   HGB 13.7 04/06/2021   HCT 42.9 04/06/2021   MCV 91.5 04/06/2021   PLT 227 04/06/2021    Lab Results  Component Value Date   CREATININE 0.83 04/06/2021    No results found for: PSA  No results found for: TESTOSTERONE  No results found for: HGBA1C  Urinalysis    Component Value Date/Time   COLORURINE YELLOW 04/06/2021 0002   APPEARANCEUR CLEAR 04/06/2021 0002   APPEARANCEUR Clear 02/28/2021 0949   LABSPEC >1.030 (H) 04/06/2021 0002   PHURINE 5.5 04/06/2021 0002   GLUCOSEU NEGATIVE 04/06/2021 0002   HGBUR MODERATE (A) 04/06/2021 0002   BILIRUBINUR NEGATIVE 04/06/2021 0002   BILIRUBINUR Negative 02/28/2021 0949   KETONESUR NEGATIVE 04/06/2021 0002   PROTEINUR NEGATIVE 04/06/2021 0002   NITRITE NEGATIVE 04/06/2021 0002   LEUKOCYTESUR NEGATIVE 04/06/2021 0002    Lab Results  Component Value Date   LABMICR See below: 02/28/2021   WBCUA 0-5 02/28/2021   LABEPIT 0-10 02/28/2021   BACTERIA NONE SEEN 04/06/2021    Pertinent Imaging: CT 5/7 and KUB from today: Images reviewed and discussed with the patient No results found for this or any previous visit.  No results found for this or any previous visit.  No results found for this or any previous visit.  No results found for this or any previous visit.  No results found for this or any previous visit.  No results found for this or any previous visit.  No results found  for this or any previous visit.  Results for orders placed during the hospital encounter of 04/05/21  CT Renal Stone Study  Narrative CLINICAL DATA:  Flank pain. Lower back pain. Right side. Dribbling of urine. History of IBS and cholecystectomy.  EXAM: CT ABDOMEN AND PELVIS WITHOUT CONTRAST  TECHNIQUE: Multidetector CT imaging of the abdomen and pelvis was performed following the standard protocol without IV contrast.  COMPARISON:  CT abdomen pelvis 11/05/2016  FINDINGS: Lower chest: No acute abnormality.  Hepatobiliary: No focal liver abnormality is  seen. Status post cholecystectomy. No biliary dilatation.  Pancreas: No focal lesion. Normal pancreatic contour. No surrounding inflammatory changes. No main pancreatic ductal dilatation.  Spleen: Normal in size without focal abnormality.  Adrenals/Urinary Tract:  No adrenal nodule bilaterally.  No left hydronephrosis. Interval development of at least moderate right hydronephrosis as well as at least moderate right hydroureter. Distally there is an associated 7 mm calcified stone at the right ureterovesicular junction. No nephrolithiasis and no contour-deforming renal mass. No ureterolithiasis or hydroureter.  The urinary bladder is decompressed with trace perivesicular fat stranding.  Stomach/Bowel: Stomach is within normal limits. No evidence of bowel wall thickening or dilatation. Fatty infiltration of the colon wall suggestive of chronic inflammatory changes. Scattered colonic diverticulosis. Appendix appears normal.  Vascular/Lymphatic: No abdominal aorta or iliac aneurysm. At least moderate atherosclerotic plaque of the aorta and its branches. No abdominal, pelvic, or inguinal lymphadenopathy.  Reproductive: Uterus and bilateral adnexa are unremarkable.  Other: No intraperitoneal free fluid. No intraperitoneal free gas. No organized fluid collection.  Musculoskeletal:  No abdominal wall hernia or  abnormality.  No suspicious lytic or blastic osseous lesions. No acute displaced fracture. Multilevel degenerative changes of the spine.  IMPRESSION: 1. Obstructive right 7 mm ureterovesicular junction stone. Given trace perivesicular fat stranding, recommend correlation with urinalysis for superimposed infection. 2. Scattered colonic diverticulosis with no acute diverticulitis. 3.  Aortic Atherosclerosis (ICD10-I70.0).   Electronically Signed By: Iven Finn M.D. On: 04/06/2021 00:30   Assessment & Plan:    1. Nephrolithiasis -We discussed the management of kidney stones. These options include observation, ureteroscopy, shockwave lithotripsy (ESWL) and percutaneous nephrolithotomy (PCNL). We discussed which options are relevant to the patient's stone(s). We discussed the natural history of kidney stones as well as the complications of untreated stones and the impact on quality of life without treatment as well as with each of the above listed treatments. We also discussed the efficacy of each treatment in its ability to clear the stone burden. With any of these management options I discussed the signs and symptoms of infection and the need for emergent treatment should these be experienced. For each option we discussed the ability of each procedure to clear the patient of their stone burden.   For observation I described the risks which include but are not limited to silent renal damage, life-threatening infection, need for emergent surgery, failure to pass stone and pain.   For ureteroscopy I described the risks which include bleeding, infection, damage to contiguous structures, positioning injury, ureteral stricture, ureteral avulsion, ureteral injury, need for prolonged ureteral stent, inability to perform ureteroscopy, need for an interval procedure, inability to clear stone burden, stent discomfort/pain, heart attack, stroke, pulmonary embolus and the inherent risks with general  anesthesia.   For shockwave lithotripsy I described the risks which include arrhythmia, kidney contusion, kidney hemorrhage, need for transfusion, pain, inability to adequately break up stone, inability to pass stone fragments, Steinstrasse, infection associated with obstructing stones, need for alternate surgical procedure, need for repeat shockwave lithotripsy, MI, CVA, PE and the inherent risks with anesthesia/conscious sedation.   For PCNL I described the risks including positioning injury, pneumothorax, hydrothorax, need for chest tube, inability to clear stone burden, renal laceration, arterial venous fistula or malformation, need for embolization of kidney, loss of kidney or renal function, need for repeat procedure, need for prolonged nephrostomy tube, ureteral avulsion, MI, CVA, PE and the inherent risks of general anesthesia.   - The patient would like to proceed with Rigth ESWL  -  Urinalysis, Routine w reflex microscopic - Abdomen 1 view (KUB)   No follow-ups on file.  Nicolette Bang, MD  [facility] [place]

## 2021-05-13 NOTE — H&P (View-Only) (Signed)
05/13/2021 1:46 PM   Ashlee Carpenter 11-Mar-1961 [id number]  Referring provider: Chalmers Guest, NP [place],  [place] [address]  Right flank pain   HPI: Ashlee Carpenter is a 60yo here for evaluation of nephrolithiasis. Starting in February 2022 she was having intermittent right flank pain with associated gross hematuria. The pain became severe in 03/2021 and she presented to the ER. CT stone study 04/05/2021 showed a 80mm right distal ureteral calculus with moderate hydronephrosis. She has not passed her calculus. Last episode of flank pain was last week. She has intermittent nausea but no vomiting. She has been checked 3 times since February for a UTI and cultures have been negative. She has urinary frequency and urgency which are new and started 3 months ago.    PMH: Past Medical History:  Diagnosis Date   Acid reflux    Allergy    DDD (degenerative disc disease), cervical    spurs in neck area   Degenerative disc disease, cervical    Degenerative lumbar disc    Diarrhea    with rectal spasms    Gastric hypoacidity    Gastritis    GERD (gastroesophageal reflux disease)    IBS (irritable bowel syndrome)    Psoriasis    Seizures (HCC)    mini seizure in right eye with fragrance exposure -  gets dizzy- last 30 secs and gone- MRI in the past - not ever seen a neurologist - last episode 6-9 months ago     Surgical History: Past Surgical History:  Procedure Laterality Date   CHOLECYSTECTOMY N/A 11/06/2016   Procedure: LAPAROSCOPIC CHOLECYSTECTOMY;  Surgeon: Aviva Signs, MD;  Location: [facility];  Service: General;  Laterality: N/A;   COLONOSCOPY     last 2001 [facility]den Rasmussen- normal with spastic colon    TONSILLECTOMY      Home Medications:  Allergies as of 05/13/2021       Reactions   Gluten Meal    Other    Chemicals, fragrances, gas fumes        Medication List        Accurate as of May 13, 2021  1:46 PM. If you have any questions, ask your nurse or  doctor.          CVS DIGESTIVE PROBIOTIC PO Take 1 tablet by mouth at bedtime.   ibuprofen 200 MG tablet Commonly known as: ADVIL Take 200 mg by mouth every 6 (six) hours as needed.   OIL OF OREGANO PO Take by mouth. Occ use   OVER THE COUNTER MEDICATION Take 1 tablet by mouth 3 (three) times daily with meals. Hydrochloric acid prozyme as needed for digestion   OVER THE COUNTER MEDICATION Tumeric curcumin with bioperine 1200mg  daily  Vitamin d3 50 mcg  Vitamin c with calcium and magnesium 500mg   pinellia and magnolia combination herb 600 - 1200mg    Omega 3 2,000 mg  Glucofit 1334 mg   oxyCODONE-acetaminophen 5-325 MG tablet Commonly known as: Percocet Take 1-2 tablets by mouth every 6 (six) hours as needed.   oxyCODONE-acetaminophen 5-325 MG tablet Commonly known as: Percocet Take 2 tablets by mouth every 4 (four) hours as needed.   tamsulosin 0.4 MG Caps capsule Commonly known as: Flomax Take 1 capsule (0.4 mg total) by mouth daily.   VITAMIN D-VITAMIN K PO Take by mouth. Takes this about 3 x a week -- Vitamin D 3 and K2        Allergies:  Allergies  Allergen Reactions   Gluten Meal    Other     Chemicals, fragrances, gas fumes    Family History: Family History  Problem Relation Age of Onset   Colon cancer Mother 53   Colon cancer Maternal Grandmother        died 56 from colon cancer    Breast cancer Paternal Aunt    Colon polyps Neg Hx    Esophageal cancer Neg Hx    Rectal cancer Neg Hx    Stomach cancer Neg Hx     Social History:  reports that she has been smoking cigarettes. She has been smoking an average of 0.50 packs per day. She has never used smokeless tobacco. She reports that she does not drink alcohol and does not use drugs.  ROS: All other review of systems were reviewed and are negative except what is noted above in HPI  Physical Exam: BP 132/62   Pulse 70   Constitutional:  Alert and oriented, No acute distress. HEENT:  [place] AT, moist mucus membranes.  Trachea midline, no masses. Cardiovascular: No clubbing, cyanosis, or edema. Respiratory: Normal respiratory effort, no increased work of breathing. GI: Abdomen is soft, nontender, nondistended, no abdominal masses GU: No CVA tenderness.  Lymph: No cervical or inguinal lymphadenopathy. Skin: No rashes, bruises or suspicious lesions. Neurologic: Grossly intact, no focal deficits, moving all 4 extremities. Psychiatric: Normal mood and affect.  Laboratory Data: Lab Results  Component Value Date   WBC 10.1 04/06/2021   HGB 13.7 04/06/2021   HCT 42.9 04/06/2021   MCV 91.5 04/06/2021   PLT 227 04/06/2021    Lab Results  Component Value Date   CREATININE 0.83 04/06/2021    No results found for: PSA  No results found for: TESTOSTERONE  No results found for: HGBA1C  Urinalysis    Component Value Date/Time   COLORURINE YELLOW 04/06/2021 0002   APPEARANCEUR CLEAR 04/06/2021 0002   APPEARANCEUR Clear 02/28/2021 0949   LABSPEC >1.030 (H) 04/06/2021 0002   PHURINE 5.5 04/06/2021 0002   GLUCOSEU NEGATIVE 04/06/2021 0002   HGBUR MODERATE (A) 04/06/2021 0002   BILIRUBINUR NEGATIVE 04/06/2021 0002   BILIRUBINUR Negative 02/28/2021 0949   KETONESUR NEGATIVE 04/06/2021 0002   PROTEINUR NEGATIVE 04/06/2021 0002   NITRITE NEGATIVE 04/06/2021 0002   LEUKOCYTESUR NEGATIVE 04/06/2021 0002    Lab Results  Component Value Date   LABMICR See below: 02/28/2021   WBCUA 0-5 02/28/2021   LABEPIT 0-10 02/28/2021   BACTERIA NONE SEEN 04/06/2021    Pertinent Imaging: CT 5/7 and KUB from today: Images reviewed and discussed with the patient No results found for this or any previous visit.  No results found for this or any previous visit.  No results found for this or any previous visit.  No results found for this or any previous visit.  No results found for this or any previous visit.  No results found for this or any previous visit.  No results found  for this or any previous visit.  Results for orders placed during the hospital encounter of 04/05/21  CT Renal Stone Study  Narrative CLINICAL DATA:  Flank pain. Lower back pain. Right side. Dribbling of urine. History of IBS and cholecystectomy.  EXAM: CT ABDOMEN AND PELVIS WITHOUT CONTRAST  TECHNIQUE: Multidetector CT imaging of the abdomen and pelvis was performed following the standard protocol without IV contrast.  COMPARISON:  CT abdomen pelvis 11/05/2016  FINDINGS: Lower chest: No acute abnormality.  Hepatobiliary: No focal liver abnormality is  seen. Status post cholecystectomy. No biliary dilatation.  Pancreas: No focal lesion. Normal pancreatic contour. No surrounding inflammatory changes. No main pancreatic ductal dilatation.  Spleen: Normal in size without focal abnormality.  Adrenals/Urinary Tract:  No adrenal nodule bilaterally.  No left hydronephrosis. Interval development of at least moderate right hydronephrosis as well as at least moderate right hydroureter. Distally there is an associated 7 mm calcified stone at the right ureterovesicular junction. No nephrolithiasis and no contour-deforming renal mass. No ureterolithiasis or hydroureter.  The urinary bladder is decompressed with trace perivesicular fat stranding.  Stomach/Bowel: Stomach is within normal limits. No evidence of bowel wall thickening or dilatation. Fatty infiltration of the colon wall suggestive of chronic inflammatory changes. Scattered colonic diverticulosis. Appendix appears normal.  Vascular/Lymphatic: No abdominal aorta or iliac aneurysm. At least moderate atherosclerotic plaque of the aorta and its branches. No abdominal, pelvic, or inguinal lymphadenopathy.  Reproductive: Uterus and bilateral adnexa are unremarkable.  Other: No intraperitoneal free fluid. No intraperitoneal free gas. No organized fluid collection.  Musculoskeletal:  No abdominal wall hernia or  abnormality.  No suspicious lytic or blastic osseous lesions. No acute displaced fracture. Multilevel degenerative changes of the spine.  IMPRESSION: 1. Obstructive right 7 mm ureterovesicular junction stone. Given trace perivesicular fat stranding, recommend correlation with urinalysis for superimposed infection. 2. Scattered colonic diverticulosis with no acute diverticulitis. 3.  Aortic Atherosclerosis (ICD10-I70.0).   Electronically Signed By: Iven Finn M.D. On: 04/06/2021 00:30   Assessment & Plan:    1. Nephrolithiasis -We discussed the management of kidney stones. These options include observation, ureteroscopy, shockwave lithotripsy (ESWL) and percutaneous nephrolithotomy (PCNL). We discussed which options are relevant to the patient's stone(s). We discussed the natural history of kidney stones as well as the complications of untreated stones and the impact on quality of life without treatment as well as with each of the above listed treatments. We also discussed the efficacy of each treatment in its ability to clear the stone burden. With any of these management options I discussed the signs and symptoms of infection and the need for emergent treatment should these be experienced. For each option we discussed the ability of each procedure to clear the patient of their stone burden.   For observation I described the risks which include but are not limited to silent renal damage, life-threatening infection, need for emergent surgery, failure to pass stone and pain.   For ureteroscopy I described the risks which include bleeding, infection, damage to contiguous structures, positioning injury, ureteral stricture, ureteral avulsion, ureteral injury, need for prolonged ureteral stent, inability to perform ureteroscopy, need for an interval procedure, inability to clear stone burden, stent discomfort/pain, heart attack, stroke, pulmonary embolus and the inherent risks with general  anesthesia.   For shockwave lithotripsy I described the risks which include arrhythmia, kidney contusion, kidney hemorrhage, need for transfusion, pain, inability to adequately break up stone, inability to pass stone fragments, Steinstrasse, infection associated with obstructing stones, need for alternate surgical procedure, need for repeat shockwave lithotripsy, MI, CVA, PE and the inherent risks with anesthesia/conscious sedation.   For PCNL I described the risks including positioning injury, pneumothorax, hydrothorax, need for chest tube, inability to clear stone burden, renal laceration, arterial venous fistula or malformation, need for embolization of kidney, loss of kidney or renal function, need for repeat procedure, need for prolonged nephrostomy tube, ureteral avulsion, MI, CVA, PE and the inherent risks of general anesthesia.   - The patient would like to proceed with Rigth ESWL  -  Urinalysis, Routine w reflex microscopic - Abdomen 1 view (KUB)   No follow-ups on file.  Nicolette Bang, MD  [facility] [place]

## 2021-05-14 LAB — MICROSCOPIC EXAMINATION

## 2021-05-14 LAB — URINALYSIS, ROUTINE W REFLEX MICROSCOPIC
Bilirubin, UA: NEGATIVE
Glucose, UA: NEGATIVE
Nitrite, UA: NEGATIVE
Protein,UA: NEGATIVE
Specific Gravity, UA: 1.02 (ref 1.005–1.030)
Urobilinogen, Ur: 0.2 mg/dL (ref 0.2–1.0)
pH, UA: 6.5 (ref 5.0–7.5)

## 2021-05-16 ENCOUNTER — Telehealth: Payer: Self-pay

## 2021-05-16 ENCOUNTER — Encounter (HOSPITAL_COMMUNITY)
Admission: RE | Admit: 2021-05-16 | Discharge: 2021-05-16 | Disposition: A | Discharge status: Home / Self Care | Payer: BC Managed Care – PPO | Source: Ambulatory Visit | Attending: Urology | Admitting: Urology

## 2021-05-16 ENCOUNTER — Other Ambulatory Visit: Payer: Self-pay

## 2021-05-16 NOTE — Telephone Encounter (Signed)
Patient wanting to know if she will need to do an Enema before the visit? Hasn't heard from the hospital regarding the covid testing or preparing for the procedure on Tues.  Please advise  Call back:  [phone]

## 2021-05-16 NOTE — Telephone Encounter (Signed)
Spoke with Ashlee Carpenter in scheduling and she stated patient is on the list to be called today. Patient called and made aware.

## 2021-05-20 ENCOUNTER — Ambulatory Visit (HOSPITAL_COMMUNITY)
Admission: RE | Admit: 2021-05-20 | Discharge: 2021-05-20 | Disposition: A | Discharge status: Home / Self Care | Payer: BC Managed Care – PPO | Attending: Urology | Admitting: Urology

## 2021-05-20 ENCOUNTER — Other Ambulatory Visit: Payer: Self-pay

## 2021-05-20 ENCOUNTER — Encounter (HOSPITAL_COMMUNITY)
Admission: RE | Disposition: A | Discharge status: Home / Self Care | Payer: Self-pay | Source: Home / Self Care | Attending: Urology

## 2021-05-20 ENCOUNTER — Encounter (HOSPITAL_COMMUNITY): Payer: Self-pay | Admitting: Urology

## 2021-05-20 ENCOUNTER — Ambulatory Visit (HOSPITAL_COMMUNITY)
Admission: RE | Admit: 2021-05-20 | Discharge: 2021-05-20 | Disposition: A | Discharge status: Home / Self Care | Payer: BC Managed Care – PPO | Source: Home / Self Care | Attending: Urology | Admitting: Urology

## 2021-05-20 DIAGNOSIS — F17210 Nicotine dependence, cigarettes, uncomplicated: Secondary | ICD-10-CM | POA: Insufficient documentation

## 2021-05-20 DIAGNOSIS — N201 Calculus of ureter: Secondary | ICD-10-CM | POA: Insufficient documentation

## 2021-05-20 DIAGNOSIS — I700 Atherosclerosis of aorta: Secondary | ICD-10-CM | POA: Diagnosis not present

## 2021-05-20 DIAGNOSIS — K5730 Diverticulosis of large intestine without perforation or abscess without bleeding: Secondary | ICD-10-CM | POA: Insufficient documentation

## 2021-05-20 HISTORY — PX: EXTRACORPOREAL SHOCK WAVE LITHOTRIPSY: SHX1557

## 2021-05-20 SURGERY — LITHOTRIPSY, ESWL
Anesthesia: LOCAL | Laterality: Right

## 2021-05-20 MED ORDER — ONDANSETRON HCL 4 MG PO TABS: 4.0000 mg | ORAL_TABLET | Freq: Every day | ORAL | 1 refills | Status: AC | PRN

## 2021-05-20 MED ORDER — TAMSULOSIN HCL 0.4 MG PO CAPS: 0.4000 mg | ORAL_CAPSULE | Freq: Every day | ORAL | 0 refills | Status: DC

## 2021-05-20 MED ORDER — DIPHENHYDRAMINE HCL 25 MG PO CAPS
ORAL_CAPSULE | ORAL | Status: AC
  Filled 2021-05-20: qty 1

## 2021-05-20 MED ORDER — DIAZEPAM 5 MG PO TABS
10.0000 mg | ORAL_TABLET | Freq: Once | ORAL | Status: AC
  Administered 2021-05-20: 10 mg via ORAL

## 2021-05-20 MED ORDER — DIPHENHYDRAMINE HCL 25 MG PO CAPS
25.0000 mg | ORAL_CAPSULE | ORAL | Status: AC
  Administered 2021-05-20: 25 mg via ORAL

## 2021-05-20 MED ORDER — DIAZEPAM 5 MG PO TABS
ORAL_TABLET | ORAL | Status: AC
  Filled 2021-05-20: qty 2

## 2021-05-20 MED ORDER — OXYCODONE-ACETAMINOPHEN 5-325 MG PO TABS: 1.0000 | ORAL_TABLET | ORAL | 0 refills | Status: DC | PRN

## 2021-05-20 NOTE — Interval H&P Note (Signed)
History and Physical Interval Note:  05/20/2021 10:10 AM  Ashlee Carpenter  has presented today for surgery, with the diagnosis of right ureteral calculus.  The various methods of treatment have been discussed with the patient and family. After consideration of risks, benefits and other options for treatment, the patient has consented to  Procedure(s): EXTRACORPOREAL SHOCK WAVE LITHOTRIPSY (ESWL) (Right) as a surgical intervention.  The patient's history has been reviewed, patient examined, no change in status, stable for surgery.  I have reviewed the patient's chart and labs.  Questions were answered to the patient's satisfaction.     Nicolette Bang

## 2021-05-26 ENCOUNTER — Encounter (HOSPITAL_COMMUNITY): Payer: Self-pay | Admitting: Urology

## 2021-06-10 ENCOUNTER — Telehealth: Payer: BC Managed Care – PPO | Admitting: Urology

## 2021-06-10 ENCOUNTER — Other Ambulatory Visit: Payer: Self-pay

## 2021-06-16 ENCOUNTER — Other Ambulatory Visit: Payer: Self-pay

## 2021-06-16 ENCOUNTER — Ambulatory Visit (HOSPITAL_COMMUNITY)
Admission: RE | Admit: 2021-06-16 | Discharge: 2021-06-16 | Disposition: A | Discharge status: Home / Self Care | Payer: BC Managed Care – PPO | Source: Ambulatory Visit | Attending: Urology | Admitting: Urology

## 2021-06-16 DIAGNOSIS — N200 Calculus of kidney: Secondary | ICD-10-CM | POA: Diagnosis present

## 2021-06-24 ENCOUNTER — Encounter: Payer: Self-pay | Admitting: Urology

## 2021-06-24 ENCOUNTER — Telehealth (INDEPENDENT_AMBULATORY_CARE_PROVIDER_SITE_OTHER): Payer: BC Managed Care – PPO | Admitting: Urology

## 2021-06-24 DIAGNOSIS — N200 Calculus of kidney: Secondary | ICD-10-CM

## 2021-06-24 NOTE — Progress Notes (Signed)
06/24/2021 1:43 PM   Ashlee Carpenter November 16, 1961 [id number]  Referring provider: Kathyrn Carpenter, Ashlee Carpenter,  Ashlee Carpenter [id number]  Patient location: home Physician location: office I connected with  Ashlee Carpenter on 06/24/21 by a video enabled telemedicine application and verified that I am speaking with the correct person using two identifiers.   I discussed the limitations of evaluation and management by telemedicine. The patient expressed understanding and agreed to proceed.    Nephrolithiasis  HPI: [name] is a 60yo here for followup for nephrolithiasis. She underwent ESWL 1 month ago and passed numerous fragments. KUB from 7/18 showed no calculi. No flank pain currently. She has not had pain in 2 weeks. No worsening LUTS   PMH: Past Medical History:  Diagnosis Date   Acid reflux    Allergy    DDD (degenerative disc disease), cervical    spurs in neck area   Degenerative disc disease, cervical    Degenerative lumbar disc    Diarrhea    with rectal spasms    Gastric hypoacidity    Gastritis    GERD (gastroesophageal reflux disease)    IBS (irritable bowel syndrome)    Psoriasis    Seizures (HCC)    mini seizure in right eye with fragrance exposure -  gets dizzy- last 30 secs and gone- MRI in the past - not ever seen a neurologist - last episode 6-9 months ago     Surgical History: Past Surgical History:  Procedure Laterality Date   CHOLECYSTECTOMY N/A 11/06/2016   Procedure: LAPAROSCOPIC CHOLECYSTECTOMY;  Surgeon: Aviva Signs, MD;  Location: [facility];  Service: General;  Laterality: N/A;   COLONOSCOPY     last 2001 [facility] Rasmussen- normal with spastic colon    EXTRACORPOREAL SHOCK WAVE LITHOTRIPSY Right 05/20/2021   Procedure: EXTRACORPOREAL SHOCK WAVE LITHOTRIPSY (ESWL);  Surgeon: Cleon Gustin, MD;  Location: [facility];  Service: Urology;  Laterality: Right;   TONSILLECTOMY      Home Medications:  Allergies as of 06/24/2021        Reactions   Gluten Meal    Other    Chemicals, fragrances, gas fumes        Medication List        Accurate as of June 24, 2021  1:43 PM. If you have any questions, ask your nurse or doctor.          [employer] DIGESTIVE PROBIOTIC PO Take 1 tablet by mouth at bedtime.   ibuprofen 200 MG tablet Commonly known as: ADVIL Take 200 mg by mouth every 6 (six) hours as needed.   OIL OF OREGANO PO Take by mouth. Occ use   ondansetron 4 MG tablet Commonly known as: Zofran Take 1 tablet (4 mg total) by mouth daily as needed for nausea or vomiting.   OVER THE COUNTER MEDICATION Take 1 tablet by mouth 3 (three) times daily with meals. Hydrochloric acid prozyme as needed for digestion   OVER THE COUNTER MEDICATION Tumeric curcumin with bioperine '1200mg'$  daily  Vitamin d3 50 mcg  Vitamin c with calcium and magnesium '500mg'$   pinellia and magnolia combination herb 600 - '1200mg'$    Omega 3 2,000 mg  Glucofit 1334 mg   oxyCODONE-acetaminophen 5-325 MG tablet Commonly known as: Percocet Take 1 tablet by mouth every 4 (four) hours as needed for moderate pain.   tamsulosin 0.4 MG Caps capsule Commonly known as: Flomax Take 1 capsule (0.4 mg total) by mouth daily.  tamsulosin 0.4 MG Caps capsule Commonly known as: FLOMAX Take 1 capsule (0.4 mg total) by mouth daily.   VITAMIN D-VITAMIN K PO Take by mouth. Takes this about 3 x a week -- Vitamin D 3 and K2        Allergies:  Allergies  Allergen Reactions   Gluten Meal    Other     Chemicals, fragrances, gas fumes    Family History: Family History  Problem Relation Age of Onset   Colon cancer Mother 24   Colon cancer Maternal Grandmother        died 57 from colon cancer    Breast cancer Paternal Aunt    Colon polyps Neg Hx    Esophageal cancer Neg Hx    Rectal cancer Neg Hx    Stomach cancer Neg Hx     Social History:  reports that she has been smoking cigarettes. She has been smoking an average of .5 packs per  day. She has never used smokeless tobacco. She reports that she does not drink alcohol and does not use drugs.  ROS: All other review of systems were reviewed and are negative except what is noted above in HPI   Laboratory Data: Lab Results  Component Value Date   WBC 10.1 04/06/2021   HGB 13.7 04/06/2021   HCT 42.9 04/06/2021   MCV 91.5 04/06/2021   PLT 227 04/06/2021    Lab Results  Component Value Date   CREATININE 0.83 04/06/2021    No results found for: PSA  No results found for: TESTOSTERONE  No results found for: HGBA1C  Urinalysis    Component Value Date/Time   COLORURINE YELLOW 04/06/2021 0002   APPEARANCEUR Hazy (A) 05/13/2021 1316   LABSPEC >1.030 (H) 04/06/2021 0002   PHURINE 5.5 04/06/2021 0002   GLUCOSEU Negative 05/13/2021 1316   HGBUR MODERATE (A) 04/06/2021 0002   BILIRUBINUR Negative 05/13/2021 1316   KETONESUR NEGATIVE 04/06/2021 0002   PROTEINUR Negative 05/13/2021 1316   PROTEINUR NEGATIVE 04/06/2021 0002   NITRITE Negative 05/13/2021 1316   NITRITE NEGATIVE 04/06/2021 0002   LEUKOCYTESUR Trace (A) 05/13/2021 1316   LEUKOCYTESUR NEGATIVE 04/06/2021 0002    Lab Results  Component Value Date   LABMICR See below: 05/13/2021   WBCUA 0-5 05/13/2021   LABEPIT 0-10 05/13/2021   MUCUS Present 05/13/2021   BACTERIA Few 05/13/2021    Pertinent Imaging: 06/16/2021 KUB: Images reviewed and discussed with the patient Results for orders placed during the hospital encounter of 06/16/21  DG Abd 1 View  Narrative CLINICAL DATA:  Right stone.  Recent lithotripsy.  EXAM: ABDOMEN - 1 VIEW  COMPARISON:  05/20/2021.  CT 04/05/2021.  FINDINGS: Surgical clips right upper quadrant. Previously identified prominent right lower ureteral stone no longer identified. Stable vascular calcifications noted the pelvis. No bowel distention. Stool noted throughout the colon. No free air. Degenerative change lumbar spine and both  hips.  IMPRESSION: Previously identified prominent right lower ureteral stone noted on CT of 04/05/2021 no longer identified. Exam stable from prior exam.   Electronically Signed By: [place] On: 06/17/2021 10:21  No results found for this or any previous visit.  No results found for this or any previous visit.  No results found for this or any previous visit.  No results found for this or any previous visit.  No results found for this or any previous visit.  No results found for this or any previous visit.  Results for orders placed during the hospital  encounter of 04/05/21  CT Renal Stone Study  Narrative CLINICAL DATA:  Flank pain. Lower back pain. Right side. Dribbling of urine. History of IBS and cholecystectomy.  EXAM: CT ABDOMEN AND PELVIS WITHOUT CONTRAST  TECHNIQUE: Multidetector CT imaging of the abdomen and pelvis was performed following the standard protocol without IV contrast.  COMPARISON:  CT abdomen pelvis 11/05/2016  FINDINGS: Lower chest: No acute abnormality.  Hepatobiliary: No focal liver abnormality is seen. Status post cholecystectomy. No biliary dilatation.  Pancreas: No focal lesion. Normal pancreatic contour. No surrounding inflammatory changes. No main pancreatic ductal dilatation.  Spleen: Normal in size without focal abnormality.  Adrenals/Urinary Tract:  No adrenal nodule bilaterally.  No left hydronephrosis. Interval development of at least moderate right hydronephrosis as well as at least moderate right hydroureter. Distally there is an associated 7 mm calcified stone at the right ureterovesicular junction. No nephrolithiasis and no contour-deforming renal mass. No ureterolithiasis or hydroureter.  The urinary bladder is decompressed with trace perivesicular fat stranding.  Stomach/Bowel: Stomach is within normal limits. No evidence of bowel wall thickening or dilatation. Fatty infiltration of the colon  wall suggestive of chronic inflammatory changes. Scattered colonic diverticulosis. Appendix appears normal.  Vascular/Lymphatic: No abdominal aorta or iliac aneurysm. At least moderate atherosclerotic plaque of the aorta and its branches. No abdominal, pelvic, or inguinal lymphadenopathy.  Reproductive: Uterus and bilateral adnexa are unremarkable.  Other: No intraperitoneal free fluid. No intraperitoneal free gas. No organized fluid collection.  Musculoskeletal:  No abdominal wall hernia or abnormality.  No suspicious lytic or blastic osseous lesions. No acute displaced fracture. Multilevel degenerative changes of the spine.  IMPRESSION: 1. Obstructive right 7 mm ureterovesicular junction stone. Given trace perivesicular fat stranding, recommend correlation with urinalysis for superimposed infection. 2. Scattered colonic diverticulosis with no acute diverticulitis. 3.  Aortic Atherosclerosis (ICD10-I70.0).   Electronically Signed By: Iven Finn M.D. On: 04/06/2021 00:30   Assessment & Plan:    Nephrolithiasis -dietary handout provided on MyChart -RTC 6 months with KUB  No follow-ups on file.  Nicolette Bang, MD  [facility] [place]

## 2021-06-24 NOTE — Patient Instructions (Signed)
Textbook of Natural Medicine (5th ed., pp. 1518-1527.e3). [place], [place]: Elsevier.">  Dietary Guidelines to Help Prevent Kidney Stones Kidney stones are deposits of minerals and salts that form inside your kidneys. Your risk of developing kidney stones may be greater depending on your diet, your lifestyle, the medicines you take, and whether you have certain medical conditions. Most people can lower their chances of developing kidney stones by following the instructions below. Your dietitian may give you more specific instructions depending on your overall health and the type of kidney stones youtend to develop. What are tips for following this plan? Reading food labels  Choose foods with "no salt added" or "low-salt" labels. Limit your salt (sodium) intake to less than 1,500 mg a day. Choose foods with calcium for each meal and snack. Try to eat about 300 mg of calcium at each meal. Foods that contain 200-500 mg of calcium a serving include: 8 oz (237 mL) of milk, calcium-fortifiednon-dairy milk, and calcium-fortifiedfruit juice. Calcium-fortified means that calcium has been added to these drinks. 8 oz (237 mL) of kefir, yogurt, and soy yogurt. 4 oz (114 g) of tofu. 1 oz (28 g) of cheese. 1 cup (150 g) of dried figs. 1 cup (91 g) of cooked broccoli. One 3 oz (85 g) can of sardines or mackerel. Most people need 1,000-1,500 mg of calcium a day. Talk to your dietitian abouthow much calcium is recommended for you. Shopping Buy plenty of fresh fruits and vegetables. Most people do not need to avoid fruits and vegetables, even if these foods contain nutrients that may contribute to kidney stones. When shopping for convenience foods, choose: Whole pieces of fruit. Pre-made salads with dressing on the side. Low-fat fruit and yogurt smoothies. Avoid buying frozen meals or prepared deli foods. These can be high in sodium. Look for foods with live cultures, such as yogurt and kefir. Choose high-fiber  grains, such as whole-wheat breads, oat bran, and wheat cereals. Cooking Do not add salt to food when cooking. Place a salt shaker on the table and allow each person to add his or her own salt to taste. Use vegetable protein, such as beans, textured vegetable protein (TVP), or tofu, instead of meat in pasta, casseroles, and soups. Meal planning Eat less salt, if told by your dietitian. To do this: Avoid eating processed or pre-made food. Avoid eating fast food. Eat less animal protein, including cheese, meat, poultry, or fish, if told by your dietitian. To do this: Limit the number of times you have meat, poultry, fish, or cheese each week. Eat a diet free of meat at least 2 days a week. Eat only one serving each day of meat, poultry, fish, or seafood. When you prepare animal protein, cut pieces into small portion sizes. For most meat and fish, one serving is about the size of the palm of your hand. Eat at least five servings of fresh fruits and vegetables each day. To do this: Keep fruits and vegetables on hand for snacks. Eat one piece of fruit or a handful of berries with breakfast. Have a salad and fruit at lunch. Have two kinds of vegetables at dinner. Limit foods that are high in a substance called oxalate. These include: Spinach (cooked), rhubarb, beets, sweet potatoes, and Swiss chard. Peanuts. Potato chips, french fries, and baked potatoes with skin on. Nuts and nut products. Chocolate. If you regularly take a diuretic medicine, make sure to eat at least 1 or 2 servings of fruits or vegetables that are   high in potassium each day. These include: Avocado. Banana. Orange, prune, carrot, or tomato juice. Baked potato. Cabbage. Beans and split peas. Lifestyle  Drink enough fluid to keep your urine pale yellow. This is the most important thing you can do. Spread your fluid intake throughout the day. If you drink alcohol: Limit how much you use to: 0-1 drink a day for women who  are not pregnant. 0-2 drinks a day for men. Be aware of how much alcohol is in your drink. In the [place] one drink equals one 12 oz bottle of beer (355 mL), one 5 oz glass of wine (148 mL), or one 1 oz glass of hard liquor (44 mL). Lose weight if told by your health care provider. Work with your dietitian to find an eating plan and weight loss strategies that work best for you.  General information Talk to your health care provider and dietitian about taking daily supplements. You may be told the following depending on your health and the cause of your kidney stones: Not to take supplements with vitamin C. To take a calcium supplement. To take a daily probiotic supplement. To take other supplements such as magnesium, fish oil, or vitamin B6. Take over-the-counter and prescription medicines only as told by your health care provider. These include supplements. What foods should I limit? Limit your intake of the following foods, or eat them as told by your dietitian. Vegetables Spinach. Rhubarb. Beets. Canned vegetables. Pickles. Olives. Baked potatoeswith skin. Grains Wheat bran. Baked goods. Salted crackers. Cereals high in sugar. Meats and other proteins Nuts. Nut butters. Large portions of meat, poultry, or fish. Salted, precooked,or cured meats, such as sausages, meat loaves, and hot dogs. Dairy Cheese. Beverages Regular soft drinks. Regular vegetable juice. Seasonings and condiments Seasoning blends with salt. Salad dressings. Soy sauce. Ketchup. Barbecue sauce. Other foods Canned soups. Canned pasta sauce. Casseroles. Pizza. Lasagna. Frozen meals.Potato chips. French fries. The items listed above may not be a complete list of foods and beverages you should limit. Contact a dietitian for more information. What foods should I avoid? Talk to your dietitian about specific foods you should avoid based on the typeof kidney stones you have and your overall health. Fruits Grapefruit. The  item listed above may not be a complete list of foods and beverages you should avoid. Contact a dietitian for more information. Summary Kidney stones are deposits of minerals and salts that form inside your kidneys. You can lower your risk of kidney stones by making changes to your diet. The most important thing you can do is drink enough fluid. Drink enough fluid to keep your urine pale yellow. Talk to your dietitian about how much calcium you should have each day, and eat less salt and animal protein as told by your dietitian. This information is not intended to replace advice given to you by your health care provider. Make sure you discuss any questions you have with your healthcare provider. Document Revised: 11/09/2019 Document Reviewed: 11/09/2019 Elsevier Patient Education  2022 [employer]  

## 2021-10-03 ENCOUNTER — Other Ambulatory Visit: Payer: Self-pay

## 2021-10-03 DIAGNOSIS — N200 Calculus of kidney: Secondary | ICD-10-CM

## 2021-12-22 ENCOUNTER — Ambulatory Visit: Payer: BC Managed Care – PPO | Admitting: Physician Assistant

## 2021-12-22 ENCOUNTER — Other Ambulatory Visit: Payer: Self-pay

## 2021-12-22 ENCOUNTER — Encounter: Payer: Self-pay | Admitting: Physician Assistant

## 2021-12-22 ENCOUNTER — Ambulatory Visit (HOSPITAL_COMMUNITY)
Admission: RE | Admit: 2021-12-22 | Discharge: 2021-12-22 | Disposition: A | Discharge status: Home / Self Care | Payer: BC Managed Care – PPO | Source: Ambulatory Visit | Attending: Urology | Admitting: Urology

## 2021-12-22 VITALS — BP 123/74 | HR 70 | Wt 175.0 lb

## 2021-12-22 DIAGNOSIS — N200 Calculus of kidney: Secondary | ICD-10-CM

## 2021-12-22 LAB — URINALYSIS, ROUTINE W REFLEX MICROSCOPIC
Bilirubin, UA: NEGATIVE
Glucose, UA: NEGATIVE
Leukocytes,UA: NEGATIVE
Nitrite, UA: NEGATIVE
RBC, UA: NEGATIVE
Specific Gravity, UA: >1.03 — ABNORMAL HIGH (ref 1.005–1.030)
Urobilinogen, Ur: 0.2 mg/dL (ref 0.2–1.0)
pH, UA: 5.5 (ref 5.0–7.5)

## 2021-12-22 NOTE — Progress Notes (Signed)
12/22/2021 1:50 PM   Ashlee Carpenter 04/16/1961 [id number]  Referring provider: Kathyrn Drown, MD [place],  [place] [address]  No chief complaint on file.   HPI: Ashlee Carpenter is a 61 year old female who presents for follow-up for nephrolithiasis.  She underwent ESWL in June 2022 and KUB in July revealed no calculi.  Today she returns stating she has been doing well with no worsening LUTS.  She has had some left-sided flank and groin pain intermittently.  No hematuria  UA clear KUB negative  06/24/21 Ashlee Carpenter is a 61yo here for followup for nephrolithiasis. She underwent ESWL 1 month ago and passed numerous fragments. KUB from 7/18 showed no calculi. No flank pain currently. She has not had pain in 2 weeks. No worsening LUTS  PMH: Past Medical History:  Diagnosis Date   Acid reflux    Allergy    DDD (degenerative disc disease), cervical    spurs in neck area   Degenerative disc disease, cervical    Degenerative lumbar disc    Diarrhea    with rectal spasms    Gastric hypoacidity    Gastritis    GERD (gastroesophageal reflux disease)    IBS (irritable bowel syndrome)    Psoriasis    Seizures (HCC)    mini seizure in right eye with fragrance exposure -  gets dizzy- last 30 secs and gone- MRI in the past - not ever seen a neurologist - last episode 6-9 months ago     Surgical History: Past Surgical History:  Procedure Laterality Date   CHOLECYSTECTOMY N/A 11/06/2016   Procedure: LAPAROSCOPIC CHOLECYSTECTOMY;  Surgeon: Aviva Signs, MD;  Location: [facility];  Service: General;  Laterality: N/A;   COLONOSCOPY     last 2001 Hayden Rasmussen- normal with spastic colon    EXTRACORPOREAL SHOCK WAVE LITHOTRIPSY Right 05/20/2021   Procedure: EXTRACORPOREAL SHOCK WAVE LITHOTRIPSY (ESWL);  Surgeon: Cleon Gustin, MD;  Location: [facility];  Service: Urology;  Laterality: Right;   TONSILLECTOMY      Home Medications:  Allergies as of 12/22/2021        Reactions   Gluten Meal    Other    Chemicals, fragrances, gas fumes        Medication List        Accurate as of December 22, 2021  1:50 PM. If you have any questions, ask your nurse or doctor.          [employer]S DIGESTIVE PROBIOTIC PO Take 1 tablet by mouth at bedtime.   ibuprofen 200 MG tablet Commonly known as: ADVIL Take 200 mg by mouth every 6 (six) hours as needed.   OIL OF OREGANO PO Take by mouth. Occ use   ondansetron 4 MG tablet Commonly known as: Zofran Take 1 tablet (4 mg total) by mouth daily as needed for nausea or vomiting.   OVER THE COUNTER MEDICATION Take 1 tablet by mouth 3 (three) times daily with meals. Hydrochloric acid prozyme as needed for digestion   OVER THE COUNTER MEDICATION Tumeric curcumin with bioperine 1200mg  daily  Vitamin d3 50 mcg  Vitamin c with calcium and magnesium 500mg   pinellia and magnolia combination herb 600 - 1200mg    Omega 3 2,000 mg  Glucofit 1334 mg   oxyCODONE-acetaminophen 5-325 MG tablet Commonly known as: Percocet Take 1 tablet by mouth every 4 (four) hours as needed for moderate pain.   tamsulosin 0.4 MG Caps capsule Commonly known as: Flomax Take 1  capsule (0.4 mg total) by mouth daily.   tamsulosin 0.4 MG Caps capsule Commonly known as: FLOMAX Take 1 capsule (0.4 mg total) by mouth daily.   VITAMIN D-VITAMIN K PO Take by mouth. Takes this about 3 x a week -- Vitamin D 3 and K2        Allergies:  Allergies  Allergen Reactions   Gluten Meal    Other     Chemicals, fragrances, gas fumes    Family History: Family History  Problem Relation Age of Onset   Colon cancer Mother 59   Colon cancer Maternal Grandmother        died 52 from colon cancer    Breast cancer Paternal Aunt    Colon polyps Neg Hx    Esophageal cancer Neg Hx    Rectal cancer Neg Hx    Stomach cancer Neg Hx     Social History:  reports that she has been smoking cigarettes. She has been smoking an average of .5 packs  per day. She has never used smokeless tobacco. She reports that she does not drink alcohol and does not use drugs.  ROS: All other review of systems were reviewed and are negative except what is noted above in HPI  Physical Exam: BP 123/74    Pulse 70    Wt 175 lb (79.4 kg)    BMI 29.12 kg/m   Constitutional:  Alert and oriented, No acute distress. HEENT: [place] AT, moist mucus membranes.  Trachea midline Cardiovascular: No clubbing, cyanosis, or edema. Respiratory: Normal respiratory effort, no increased work of breathing. GI: Abdomen is soft, nontender, nondistended, no abdominal masses GU: No CVA tenderness. Skin: No rashes, bruises or suspicious lesions. Neurologic: Grossly intact, no focal deficits, moving all 4 extremities. Psychiatric: Normal mood and affect.  Laboratory Data: Lab Results  Component Value Date   WBC 10.1 04/06/2021   HGB 13.7 04/06/2021   HCT 42.9 04/06/2021   MCV 91.5 04/06/2021   PLT 227 04/06/2021    Lab Results  Component Value Date   CREATININE 0.83 04/06/2021     Urinalysis    Component Value Date/Time   COLORURINE YELLOW 04/06/2021 0002   APPEARANCEUR Hazy (A) 05/13/2021 1316   LABSPEC >1.030 (H) 04/06/2021 0002   PHURINE 5.5 04/06/2021 0002   GLUCOSEU Negative 05/13/2021 1316   HGBUR MODERATE (A) 04/06/2021 0002   BILIRUBINUR Negative 05/13/2021 1316   KETONESUR NEGATIVE 04/06/2021 0002   PROTEINUR Negative 05/13/2021 1316   PROTEINUR NEGATIVE 04/06/2021 0002   NITRITE Negative 05/13/2021 1316   NITRITE NEGATIVE 04/06/2021 0002   LEUKOCYTESUR Trace (A) 05/13/2021 1316   LEUKOCYTESUR NEGATIVE 04/06/2021 0002    Lab Results  Component Value Date   LABMICR See below: 05/13/2021   WBCUA 0-5 05/13/2021   LABEPIT 0-10 05/13/2021   MUCUS Present 05/13/2021   BACTERIA Few 05/13/2021    Pertinent Imaging: KUB today shows no evidence of calcification indicating a stone.   Results for orders placed during the hospital encounter of  06/16/21  DG Abd 1 View  Narrative CLINICAL DATA:  Right stone.  Recent lithotripsy.  EXAM: ABDOMEN - 1 VIEW  COMPARISON:  05/20/2021.  CT 04/05/2021.  FINDINGS: Surgical clips right upper quadrant. Previously identified prominent right lower ureteral stone no longer identified. Stable vascular calcifications noted the pelvis. No bowel distention. Stool noted throughout the colon. No free air. Degenerative change lumbar spine and both hips.  IMPRESSION: Previously identified prominent right lower ureteral stone noted on CT of 04/05/2021  no longer identified. Exam stable from prior exam.   Electronically Signed By: [place] On: 06/17/2021 10:21  No results found for this or any previous visit.  No results found for this or any previous visit.  No results found for this or any previous visit.  No results found for this or any previous visit.  No results found for this or any previous visit.  No results found for this or any previous visit.  Results for orders placed during the hospital encounter of 04/05/21  CT Renal Stone Study  Narrative CLINICAL DATA:  Flank pain. Lower back pain. Right side. Dribbling of urine. History of IBS and cholecystectomy.  EXAM: CT ABDOMEN AND PELVIS WITHOUT CONTRAST  TECHNIQUE: Multidetector CT imaging of the abdomen and pelvis was performed following the standard protocol without IV contrast.  COMPARISON:  CT abdomen pelvis 11/05/2016  FINDINGS: Lower chest: No acute abnormality.  Hepatobiliary: No focal liver abnormality is seen. Status post cholecystectomy. No biliary dilatation.  Pancreas: No focal lesion. Normal pancreatic contour. No surrounding inflammatory changes. No main pancreatic ductal dilatation.  Spleen: Normal in size without focal abnormality.  Adrenals/Urinary Tract:  No adrenal nodule bilaterally.  No left hydronephrosis. Interval development of at least moderate right hydronephrosis as  well as at least moderate right hydroureter. Distally there is an associated 7 mm calcified stone at the right ureterovesicular junction. No nephrolithiasis and no contour-deforming renal mass. No ureterolithiasis or hydroureter.  The urinary bladder is decompressed with trace perivesicular fat stranding.  Stomach/Bowel: Stomach is within normal limits. No evidence of bowel wall thickening or dilatation. Fatty infiltration of the colon wall suggestive of chronic inflammatory changes. Scattered colonic diverticulosis. Appendix appears normal.  Vascular/Lymphatic: No abdominal aorta or iliac aneurysm. At least moderate atherosclerotic plaque of the aorta and its branches. No abdominal, pelvic, or inguinal lymphadenopathy.  Reproductive: Uterus and bilateral adnexa are unremarkable.  Other: No intraperitoneal free fluid. No intraperitoneal free gas. No organized fluid collection.  Musculoskeletal:  No abdominal wall hernia or abnormality.  No suspicious lytic or blastic osseous lesions. No acute displaced fracture. Multilevel degenerative changes of the spine.  IMPRESSION: 1. Obstructive right 7 mm ureterovesicular junction stone. Given trace perivesicular fat stranding, recommend correlation with urinalysis for superimposed infection. 2. Scattered colonic diverticulosis with no acute diverticulitis. 3.  Aortic Atherosclerosis (ICD10-I70.0).   Electronically Signed By: Iven Finn M.D. On: 04/06/2021 00:30   Assessment & Plan:    1. Nephrolithiasis KUB shows no evidence of calcification today Pt to continue stone prevention diet and increase fluid intake FU in 6 months after Renal US - Urinalysis, Routine w reflex microscopic   No follow-ups on file.  [place], Berneice Heinrich, PA-C  [facility] [place]

## 2021-12-22 NOTE — Progress Notes (Signed)
Urological Symptom Review  Patient is experiencing the following symptoms: Get up at night to urinate Stream starts and stops Trouble starting stream Weak stream   Review of Systems  Gastrointestinal (upper)  : Indigestion/heartburn  Gastrointestinal (lower) : Diarrhea  Constitutional : Negative for symptoms  Skin: Skin rash/lesion  Eyes: Negative for eye symptoms  Ear/Nose/Throat : Negative for Ear/Nose/Throat symptoms  Hematologic/Lymphatic: Negative for Hematologic/Lymphatic symptoms  Cardiovascular : Negative for cardiovascular symptoms  Respiratory : Negative for respiratory symptoms  Endocrine: Negative for endocrine symptoms  Musculoskeletal: Back pain Joint pain  Neurological: Negative for neurological symptoms  Psychologic: Negative for psychiatric symptoms

## 2021-12-26 ENCOUNTER — Ambulatory Visit: Payer: BC Managed Care – PPO | Admitting: Urology

## 2021-12-29 ENCOUNTER — Ambulatory Visit: Payer: BC Managed Care – PPO | Admitting: Urology

## 2022-06-09 ENCOUNTER — Telehealth: Payer: Self-pay

## 2022-06-09 NOTE — Telephone Encounter (Signed)
Patient called wanting to know if we could send the order for her renal u/s to [facility]. She advised it will be cheaper for her when filing her insurance. I advised patient that they were not a [facility] and I would consult with you and call her back.    Thank you

## 2022-06-22 ENCOUNTER — Ambulatory Visit: Payer: BC Managed Care – PPO | Admitting: Urology

## 2022-06-23 ENCOUNTER — Ambulatory Visit (HOSPITAL_COMMUNITY)
Admission: RE | Admit: 2022-06-23 | Discharge: 2022-06-23 | Disposition: A | Discharge status: Home / Self Care | Payer: BC Managed Care – PPO | Source: Ambulatory Visit | Attending: Physician Assistant | Admitting: Physician Assistant

## 2022-06-23 DIAGNOSIS — N200 Calculus of kidney: Secondary | ICD-10-CM | POA: Insufficient documentation

## 2022-06-29 ENCOUNTER — Ambulatory Visit: Payer: BC Managed Care – PPO | Admitting: Urology

## 2022-09-21 ENCOUNTER — Other Ambulatory Visit (HOSPITAL_COMMUNITY): Payer: Self-pay | Admitting: Family Medicine

## 2022-09-21 DIAGNOSIS — Z1231 Encounter for screening mammogram for malignant neoplasm of breast: Secondary | ICD-10-CM

## 2022-10-19 ENCOUNTER — Ambulatory Visit (HOSPITAL_COMMUNITY): Payer: BC Managed Care – PPO

## 2022-10-26 ENCOUNTER — Ambulatory Visit (HOSPITAL_COMMUNITY)
Admission: RE | Admit: 2022-10-26 | Discharge: 2022-10-26 | Disposition: A | Discharge status: Home / Self Care | Payer: BC Managed Care – PPO | Source: Ambulatory Visit | Attending: Family Medicine | Admitting: Family Medicine

## 2022-10-26 DIAGNOSIS — Z1231 Encounter for screening mammogram for malignant neoplasm of breast: Secondary | ICD-10-CM | POA: Insufficient documentation

## 2022-12-15 ENCOUNTER — Ambulatory Visit: Payer: BC Managed Care – PPO | Admitting: Family Medicine

## 2022-12-15 VITALS — BP 128/82 | HR 70 | Temp 98.5°F | Wt 182.4 lb

## 2022-12-15 DIAGNOSIS — R6889 Other general symptoms and signs: Secondary | ICD-10-CM

## 2022-12-15 DIAGNOSIS — J019 Acute sinusitis, unspecified: Secondary | ICD-10-CM | POA: Diagnosis not present

## 2022-12-15 MED ORDER — DOXYCYCLINE HYCLATE 100 MG PO TABS: 100.0000 mg | ORAL_TABLET | Freq: Two times a day (BID) | ORAL | 0 refills | Status: DC

## 2022-12-15 MED ORDER — HYDROCODONE BIT-HOMATROP MBR 5-1.5 MG/5ML PO SOLN: ORAL | 0 refills | Status: DC

## 2022-12-15 NOTE — Progress Notes (Signed)
   Subjective:    Patient ID: Ashlee Carpenter, female    DOB: 04-Jun-1961, 62 y.o.   MRN: [id number]  Cough The current episode started in the past 7 days. The cough is Productive of sputum. Associated symptoms include headaches.  Dizziness The current episode started in the past 7 days. Associated symptoms include coughing, headaches and nausea.   Feels fatigued tired rundown body aches symptoms present over the past week and a half no wheezing or difficulty breathing   Review of Systems  Respiratory:  Positive for cough.   Gastrointestinal:  Positive for nausea.  Neurological:  Positive for dizziness and headaches.       Objective:   Physical Exam Gen-NAD not toxic TMS-normal bilateral T- normal no redness Chest-CTA respiratory rate normal no crackles CV RRR no murmur Skin-warm dry Neuro-grossly normal        Assessment & Plan:   Flulike illness quite possibly had the flu but now getting better to some degree Hold off triple swab would not be of any value currently Has significant sinus pressure pain discomfort Recommend treatment with antibiotics follow-up if ongoing issues

## 2023-03-17 ENCOUNTER — Ambulatory Visit (INDEPENDENT_AMBULATORY_CARE_PROVIDER_SITE_OTHER): Payer: BC Managed Care – PPO | Admitting: Urology

## 2023-03-17 ENCOUNTER — Ambulatory Visit (HOSPITAL_COMMUNITY)
Admission: RE | Admit: 2023-03-17 | Discharge: 2023-03-17 | Disposition: A | Discharge status: Home / Self Care | Payer: BC Managed Care – PPO | Source: Ambulatory Visit | Attending: Urology | Admitting: Urology

## 2023-03-17 ENCOUNTER — Encounter: Payer: Self-pay | Admitting: Urology

## 2023-03-17 VITALS — BP 154/86 | HR 68

## 2023-03-17 DIAGNOSIS — N200 Calculus of kidney: Secondary | ICD-10-CM

## 2023-03-17 LAB — MICROSCOPIC EXAMINATION: Bacteria, UA: NONE SEEN

## 2023-03-17 LAB — URINALYSIS, ROUTINE W REFLEX MICROSCOPIC
Bilirubin, UA: NEGATIVE
Glucose, UA: NEGATIVE
Leukocytes,UA: NEGATIVE
Nitrite, UA: NEGATIVE
Protein,UA: NEGATIVE
Specific Gravity, UA: 1.015 (ref 1.005–1.030)
Urobilinogen, Ur: 0.2 mg/dL (ref 0.2–1.0)
pH, UA: 6 (ref 5.0–7.5)

## 2023-03-17 MED ORDER — HYDROCODONE-ACETAMINOPHEN 5-325 MG PO TABS: 1.0000 | ORAL_TABLET | Freq: Four times a day (QID) | ORAL | 0 refills | Status: AC | PRN

## 2023-03-17 MED ORDER — TAMSULOSIN HCL 0.4 MG PO CAPS: 0.4000 mg | ORAL_CAPSULE | Freq: Every day | ORAL | 0 refills | Status: DC

## 2023-03-17 NOTE — Progress Notes (Signed)
03/17/2023 2:38 PM   Ashlee Carpenter May 18, 1961 [id number]  Referring provider: Babs Sciara, MD [address] [place],  [place] [address]  Followup nephrolithiasis   HPI: [name] is a 62yo here for followup for nephrolithiasis. She was doing well until 2 weeks ago when she developed intermittent right flank pain. In the past week the right flank is constant. No exacerbating events. She denies any LUTS. KUB from today she a right renal lower pole calculus.   PMH: Past Medical History:  Diagnosis Date   Acid reflux    Allergy    DDD (degenerative disc disease), cervical    spurs in neck area   Degenerative disc disease, cervical    Degenerative lumbar disc    Diarrhea    with rectal spasms    Gastric hypoacidity    Gastritis    GERD (gastroesophageal reflux disease)    IBS (irritable bowel syndrome)    Psoriasis    Seizures (HCC)    mini seizure in right eye with fragrance exposure -  gets dizzy- last 30 secs and gone- MRI in the past - not ever seen a neurologist - last episode 6-9 months ago     Surgical History: Past Surgical History:  Procedure Laterality Date   CHOLECYSTECTOMY N/A 11/06/2016   Procedure: LAPAROSCOPIC CHOLECYSTECTOMY;  Surgeon: Franky Macho, MD;  Location: [facility];  Service: General;  Laterality: N/A;   COLONOSCOPY     last 2001 Rowan Blase- normal with spastic colon    EXTRACORPOREAL SHOCK WAVE LITHOTRIPSY Right 05/20/2021   Procedure: EXTRACORPOREAL SHOCK WAVE LITHOTRIPSY (ESWL);  Surgeon: Malen Gauze, MD;  Location: [facility];  Service: Urology;  Laterality: Right;   TONSILLECTOMY      Home Medications:  Allergies as of 03/17/2023       Reactions   Gluten Meal    Other    Chemicals, fragrances, gas fumes        Medication List        Accurate as of March 17, 2023  2:38 PM. If you have any questions, ask your nurse or doctor.          CVS DIGESTIVE PROBIOTIC PO Take 1 tablet by mouth at bedtime.    doxycycline 100 MG tablet Commonly known as: VIBRA-TABS Take 1 tablet (100 mg total) by mouth 2 (two) times daily.   HYDROcodone bit-homatropine 5-1.5 MG/5ML syrup Commonly known as: HYCODAN 1 tsp q 6 to 8 hours prn cough only for home use   ibuprofen 200 MG tablet Commonly known as: ADVIL Take 200 mg by mouth every 6 (six) hours as needed.   OIL OF OREGANO PO Take by mouth. Occ use   OVER THE COUNTER MEDICATION Take 1 tablet by mouth 3 (three) times daily with meals. Hydrochloric acid prozyme as needed for digestion   OVER THE COUNTER MEDICATION Tumeric curcumin with bioperine 1200mg  daily  Vitamin d3 50 mcg  Vitamin c with calcium and magnesium 500mg   pinellia and magnolia combination herb 600 - 1200mg    Omega 3 2,000 mg  Glucofit 1334 mg   tamsulosin 0.4 MG Caps capsule Commonly known as: Flomax Take 1 capsule (0.4 mg total) by mouth daily.   tamsulosin 0.4 MG Caps capsule Commonly known as: FLOMAX Take 1 capsule (0.4 mg total) by mouth daily.   VITAMIN D-VITAMIN K PO Take by mouth. Takes this about 3 x a week -- Vitamin D 3 and K2  Allergies:  Allergies  Allergen Reactions   Gluten Meal    Other     Chemicals, fragrances, gas fumes    Family History: Family History  Problem Relation Age of Onset   Colon cancer Mother 40   Colon cancer Maternal Grandmother        died 5 from colon cancer    Breast cancer Paternal Aunt    Colon polyps Neg Hx    Esophageal cancer Neg Hx    Rectal cancer Neg Hx    Stomach cancer Neg Hx     Social History:  reports that she has been smoking cigarettes. She has been smoking an average of .5 packs per day. She has never used smokeless tobacco. She reports that she does not drink alcohol and does not use drugs.  ROS: All other review of systems were reviewed and are negative except what is noted above in HPI  Physical Exam: BP (!) 154/86   Pulse 68   Constitutional:  Alert and oriented, No acute  distress. HEENT: [place] AT, moist mucus membranes.  Trachea midline, no masses. Cardiovascular: No clubbing, cyanosis, or edema. Respiratory: Normal respiratory effort, no increased work of breathing. GI: Abdomen is soft, nontender, nondistended, no abdominal masses GU: No CVA tenderness.  Lymph: No cervical or inguinal lymphadenopathy. Skin: No rashes, bruises or suspicious lesions. Neurologic: Grossly intact, no focal deficits, moving all 4 extremities. Psychiatric: Normal mood and affect.  Laboratory Data: Lab Results  Component Value Date   WBC 10.1 04/06/2021   HGB 13.7 04/06/2021   HCT 42.9 04/06/2021   MCV 91.5 04/06/2021   PLT 227 04/06/2021    Lab Results  Component Value Date   CREATININE 0.83 04/06/2021    No results found for: "PSA"  No results found for: "TESTOSTERONE"  No results found for: "HGBA1C"  Urinalysis    Component Value Date/Time   COLORURINE YELLOW 04/06/2021 0002   APPEARANCEUR Clear 12/22/2021 1337   LABSPEC >1.030 (H) 04/06/2021 0002   PHURINE 5.5 04/06/2021 0002   GLUCOSEU Negative 12/22/2021 1337   HGBUR MODERATE (A) 04/06/2021 0002   BILIRUBINUR Negative 12/22/2021 1337   KETONESUR NEGATIVE 04/06/2021 0002   PROTEINUR 1+ (A) 12/22/2021 1337   PROTEINUR NEGATIVE 04/06/2021 0002   NITRITE Negative 12/22/2021 1337   NITRITE NEGATIVE 04/06/2021 0002   LEUKOCYTESUR Negative 12/22/2021 1337   LEUKOCYTESUR NEGATIVE 04/06/2021 0002    Lab Results  Component Value Date   LABMICR Comment 12/22/2021   WBCUA 0-5 05/13/2021   LABEPIT 0-10 05/13/2021   MUCUS Present 05/13/2021   BACTERIA Few 05/13/2021    Pertinent Imaging: KUb today: Images reviewed and discussed with the patient  Results for orders placed during the hospital encounter of 12/22/21  DG Abd 1 View  Narrative CLINICAL DATA:  Nephrolithiasis.  EXAM: ABDOMEN - 1 VIEW  COMPARISON:  Radiograph dated 06/16/2021 and CT dated 04/06/2021.  FINDINGS: No radiopaque calculi  noted over the renal silhouette or within the pelvis. Right upper quadrant cholecystectomy clips. There is no bowel dilatation or evidence of obstruction. No free air. No acute osseous pathology.  IMPRESSION: Negative.   Electronically Signed By: Elgie Collard M.D. On: 12/23/2021 00:22  No results found for this or any previous visit.  No results found for this or any previous visit.  No results found for this or any previous visit.  Results for orders placed during the hospital encounter of 06/23/22  Ultrasound renal complete  Narrative CLINICAL DATA:  Nephrolithiasis follow-up.  Post  lithotripsy.  EXAM: RENAL / URINARY TRACT ULTRASOUND COMPLETE  COMPARISON:  CT of the abdomen Apr 06, 2021  FINDINGS: Right Kidney:  Renal measurements: 9.7 x 4.9 x 6.3 cm = volume: 158 mL. Echogenicity within normal limits. No mass or hydronephrosis visualized.  Left Kidney:  Renal measurements: 11.0 x 6.0 x 4.7 cm = volume: 162 mL. Echogenicity within normal limits. No mass or hydronephrosis visualized.  Bladder:  Appears normal for degree of bladder distention. Bilateral ureteral jets seen.  Other:  None.  IMPRESSION: Normal renal ultrasound.   Electronically Signed By: Ted Mcalpine M.D. On: 06/23/2022 16:17  No valid procedures specified. No results found for this or any previous visit.  Results for orders placed during the hospital encounter of 04/05/21  CT Renal Stone Study  Narrative CLINICAL DATA:  Flank pain. Lower back pain. Right side. Dribbling of urine. History of IBS and cholecystectomy.  EXAM: CT ABDOMEN AND PELVIS WITHOUT CONTRAST  TECHNIQUE: Multidetector CT imaging of the abdomen and pelvis was performed following the standard protocol without IV contrast.  COMPARISON:  CT abdomen pelvis 11/05/2016  FINDINGS: Lower chest: No acute abnormality.  Hepatobiliary: No focal liver abnormality is seen. Status post cholecystectomy.  No biliary dilatation.  Pancreas: No focal lesion. Normal pancreatic contour. No surrounding inflammatory changes. No main pancreatic ductal dilatation.  Spleen: Normal in size without focal abnormality.  Adrenals/Urinary Tract:  No adrenal nodule bilaterally.  No left hydronephrosis. Interval development of at least moderate right hydronephrosis as well as at least moderate right hydroureter. Distally there is an associated 7 mm calcified stone at the right ureterovesicular junction. No nephrolithiasis and no contour-deforming renal mass. No ureterolithiasis or hydroureter.  The urinary bladder is decompressed with trace perivesicular fat stranding.  Stomach/Bowel: Stomach is within normal limits. No evidence of bowel wall thickening or dilatation. Fatty infiltration of the colon wall suggestive of chronic inflammatory changes. Scattered colonic diverticulosis. Appendix appears normal.  Vascular/Lymphatic: No abdominal aorta or iliac aneurysm. At least moderate atherosclerotic plaque of the aorta and its branches. No abdominal, pelvic, or inguinal lymphadenopathy.  Reproductive: Uterus and bilateral adnexa are unremarkable.  Other: No intraperitoneal free fluid. No intraperitoneal free gas. No organized fluid collection.  Musculoskeletal:  No abdominal wall hernia or abnormality.  No suspicious lytic or blastic osseous lesions. No acute displaced fracture. Multilevel degenerative changes of the spine.  IMPRESSION: 1. Obstructive right 7 mm ureterovesicular junction stone. Given trace perivesicular fat stranding, recommend correlation with urinalysis for superimposed infection. 2. Scattered colonic diverticulosis with no acute diverticulitis. 3.  Aortic Atherosclerosis (ICD10-I70.0).   Electronically Signed By: Tish Frederickson M.D. On: 04/06/2021 00:30   Assessment & Plan:    1. Nephrolithiasis -We discussed the management of kidney stones. These options  include observation, ureteroscopy, shockwave lithotripsy (ESWL) and percutaneous nephrolithotomy (PCNL). We discussed which options are relevant to the patient's stone(s). We discussed the natural history of kidney stones as well as the complications of untreated stones and the impact on quality of life without treatment as well as with each of the above listed treatments. We also discussed the efficacy of each treatment in its ability to clear the stone burden. With any of these management options I discussed the signs and symptoms of infection and the need for emergent treatment should these be experienced. For each option we discussed the ability of each procedure to clear the patient of their stone burden.   For observation I described the risks which include but are not limited  to silent renal damage, life-threatening infection, need for emergent surgery, failure to pass stone and pain.   For ureteroscopy I described the risks which include bleeding, infection, damage to contiguous structures, positioning injury, ureteral stricture, ureteral avulsion, ureteral injury, need for prolonged ureteral stent, inability to perform ureteroscopy, need for an interval procedure, inability to clear stone burden, stent discomfort/pain, heart attack, stroke, pulmonary embolus and the inherent risks with general anesthesia.   For shockwave lithotripsy I described the risks which include arrhythmia, kidney contusion, kidney hemorrhage, need for transfusion, pain, inability to adequately break up stone, inability to pass stone fragments, Steinstrasse, infection associated with obstructing stones, need for alternate surgical procedure, need for repeat shockwave lithotripsy, MI, CVA, PE and the inherent risks with anesthesia/conscious sedation.   For PCNL I described the risks including positioning injury, pneumothorax, hydrothorax, need for chest tube, inability to clear stone burden, renal laceration, arterial venous  fistula or malformation, need for embolization of kidney, loss of kidney or renal function, need for repeat procedure, need for prolonged nephrostomy tube, ureteral avulsion, MI, CVA, PE and the inherent risks of general anesthesia.   - The patient would like to proceed with right ESWL - Urinalysis, Routine w reflex microscopic - DG Abd 1 View   No follow-ups on file.  Wilkie Aye, MD  [facility]

## 2023-03-17 NOTE — H&P (View-Only) (Signed)
 03/17/2023 2:38 PM   Akanksha J Trovato 08/02/1961 [id number]  Referring provider: Luking, Scott A, MD [address] [place],  [place] [address]  Followup nephrolithiasis   HPI: [name] is a 62yo here for followup for nephrolithiasis. She was doing well until 2 weeks ago when she developed intermittent right flank pain. In the past week the right flank is constant. No exacerbating events. She denies any LUTS. KUB from today she a right renal lower pole calculus.   PMH: Past Medical History:  Diagnosis Date   Acid reflux    Allergy    DDD (degenerative disc disease), cervical    spurs in neck area   Degenerative disc disease, cervical    Degenerative lumbar disc    Diarrhea    with rectal spasms    Gastric hypoacidity    Gastritis    GERD (gastroesophageal reflux disease)    IBS (irritable bowel syndrome)    Psoriasis    Seizures (HCC)    mini seizure in right eye with fragrance exposure -  gets dizzy- last 30 secs and gone- MRI in the past - not ever seen a neurologist - last episode 6-9 months ago     Surgical History: Past Surgical History:  Procedure Laterality Date   CHOLECYSTECTOMY N/A 11/06/2016   Procedure: LAPAROSCOPIC CHOLECYSTECTOMY;  Surgeon: Mark Jenkins, MD;  Location: [facility];  Service: General;  Laterality: N/A;   COLONOSCOPY     last 2001 [facility]- normal with spastic colon    EXTRACORPOREAL SHOCK WAVE LITHOTRIPSY Right 05/20/2021   Procedure: EXTRACORPOREAL SHOCK WAVE LITHOTRIPSY (ESWL);  Surgeon: Falcon Mccaskey L, MD;  Location: [facility];  Service: Urology;  Laterality: Right;   TONSILLECTOMY      Home Medications:  Allergies as of 03/17/2023       Reactions   Gluten Meal    Other    Chemicals, fragrances, gas fumes        Medication List        Accurate as of March 17, 2023  2:38 PM. If you have any questions, ask your nurse or doctor.          [employer] DIGESTIVE PROBIOTIC PO Take 1 tablet by mouth at bedtime.    doxycycline 100 MG tablet Commonly known as: VIBRA-TABS Take 1 tablet (100 mg total) by mouth 2 (two) times daily.   HYDROcodone bit-homatropine 5-1.5 MG/5ML syrup Commonly known as: HYCODAN 1 tsp q 6 to 8 hours prn cough only for home use   ibuprofen 200 MG tablet Commonly known as: ADVIL Take 200 mg by mouth every 6 (six) hours as needed.   OIL OF OREGANO PO Take by mouth. Occ use   OVER THE COUNTER MEDICATION Take 1 tablet by mouth 3 (three) times daily with meals. Hydrochloric acid prozyme as needed for digestion   OVER THE COUNTER MEDICATION Tumeric curcumin with bioperine 1200mg daily  Vitamin d3 50 mcg  Vitamin c with calcium and magnesium 500mg  pinellia and magnolia combination herb 600 - 1200mg   Omega 3 2,000 mg  Glucofit 1334 mg   tamsulosin 0.4 MG Caps capsule Commonly known as: Flomax Take 1 capsule (0.4 mg total) by mouth daily.   tamsulosin 0.4 MG Caps capsule Commonly known as: FLOMAX Take 1 capsule (0.4 mg total) by mouth daily.   VITAMIN D-VITAMIN K PO Take by mouth. Takes this about 3 x a week -- Vitamin D 3 and K2          Allergies:  Allergies  Allergen Reactions   Gluten Meal    Other     Chemicals, fragrances, gas fumes    Family History: Family History  Problem Relation Age of Onset   Colon cancer Mother 46   Colon cancer Maternal Grandmother        died 1981 from colon cancer    Breast cancer Paternal Aunt    Colon polyps Neg Hx    Esophageal cancer Neg Hx    Rectal cancer Neg Hx    Stomach cancer Neg Hx     Social History:  reports that she has been smoking cigarettes. She has been smoking an average of .5 packs per day. She has never used smokeless tobacco. She reports that she does not drink alcohol and does not use drugs.  ROS: All other review of systems were reviewed and are negative except what is noted above in HPI  Physical Exam: BP (!) 154/86   Pulse 68   Constitutional:  Alert and oriented, No acute  distress. HEENT: [place] AT, moist mucus membranes.  Trachea midline, no masses. Cardiovascular: No clubbing, cyanosis, or edema. Respiratory: Normal respiratory effort, no increased work of breathing. GI: Abdomen is soft, nontender, nondistended, no abdominal masses GU: No CVA tenderness.  Lymph: No cervical or inguinal lymphadenopathy. Skin: No rashes, bruises or suspicious lesions. Neurologic: Grossly intact, no focal deficits, moving all 4 extremities. Psychiatric: Normal mood and affect.  Laboratory Data: Lab Results  Component Value Date   WBC 10.1 04/06/2021   HGB 13.7 04/06/2021   HCT 42.9 04/06/2021   MCV 91.5 04/06/2021   PLT 227 04/06/2021    Lab Results  Component Value Date   CREATININE 0.83 04/06/2021    No results found for: "PSA"  No results found for: "TESTOSTERONE"  No results found for: "HGBA1C"  Urinalysis    Component Value Date/Time   COLORURINE YELLOW 04/06/2021 0002   APPEARANCEUR Clear 12/22/2021 1337   LABSPEC >1.030 (H) 04/06/2021 0002   PHURINE 5.5 04/06/2021 0002   GLUCOSEU Negative 12/22/2021 1337   HGBUR MODERATE (A) 04/06/2021 0002   BILIRUBINUR Negative 12/22/2021 1337   KETONESUR NEGATIVE 04/06/2021 0002   PROTEINUR 1+ (A) 12/22/2021 1337   PROTEINUR NEGATIVE 04/06/2021 0002   NITRITE Negative 12/22/2021 1337   NITRITE NEGATIVE 04/06/2021 0002   LEUKOCYTESUR Negative 12/22/2021 1337   LEUKOCYTESUR NEGATIVE 04/06/2021 0002    Lab Results  Component Value Date   LABMICR Comment 12/22/2021   WBCUA 0-5 05/13/2021   LABEPIT 0-10 05/13/2021   MUCUS Present 05/13/2021   BACTERIA Few 05/13/2021    Pertinent Imaging: KUb today: Images reviewed and discussed with the patient  Results for orders placed during the hospital encounter of 12/22/21  DG Abd 1 View  Narrative CLINICAL DATA:  Nephrolithiasis.  EXAM: ABDOMEN - 1 VIEW  COMPARISON:  Radiograph dated 06/16/2021 and CT dated 04/06/2021.  FINDINGS: No radiopaque calculi  noted over the renal silhouette or within the pelvis. Right upper quadrant cholecystectomy clips. There is no bowel dilatation or evidence of obstruction. No free air. No acute osseous pathology.  IMPRESSION: Negative.   Electronically Signed By: Arash  Radparvar M.D. On: 12/23/2021 00:22  No results found for this or any previous visit.  No results found for this or any previous visit.  No results found for this or any previous visit.  Results for orders placed during the hospital encounter of 06/23/22  Ultrasound renal complete  Narrative CLINICAL DATA:  Nephrolithiasis follow-up.  Post   lithotripsy.  EXAM: RENAL / URINARY TRACT ULTRASOUND COMPLETE  COMPARISON:  CT of the abdomen Apr 06, 2021  FINDINGS: Right Kidney:  Renal measurements: 9.7 x 4.9 x 6.3 cm = volume: 158 mL. Echogenicity within normal limits. No mass or hydronephrosis visualized.  Left Kidney:  Renal measurements: 11.0 x 6.0 x 4.7 cm = volume: 162 mL. Echogenicity within normal limits. No mass or hydronephrosis visualized.  Bladder:  Appears normal for degree of bladder distention. Bilateral ureteral jets seen.  Other:  None.  IMPRESSION: Normal renal ultrasound.   Electronically Signed By: Dobrinka  Dimitrova M.D. On: 06/23/2022 16:17  No valid procedures specified. No results found for this or any previous visit.  Results for orders placed during the hospital encounter of 04/05/21  CT Renal Stone Study  Narrative CLINICAL DATA:  Flank pain. Lower back pain. Right side. Dribbling of urine. History of IBS and cholecystectomy.  EXAM: CT ABDOMEN AND PELVIS WITHOUT CONTRAST  TECHNIQUE: Multidetector CT imaging of the abdomen and pelvis was performed following the standard protocol without IV contrast.  COMPARISON:  CT abdomen pelvis 11/05/2016  FINDINGS: Lower chest: No acute abnormality.  Hepatobiliary: No focal liver abnormality is seen. Status post cholecystectomy.  No biliary dilatation.  Pancreas: No focal lesion. Normal pancreatic contour. No surrounding inflammatory changes. No main pancreatic ductal dilatation.  Spleen: Normal in size without focal abnormality.  Adrenals/Urinary Tract:  No adrenal nodule bilaterally.  No left hydronephrosis. Interval development of at least moderate right hydronephrosis as well as at least moderate right hydroureter. Distally there is an associated 7 mm calcified stone at the right ureterovesicular junction. No nephrolithiasis and no contour-deforming renal mass. No ureterolithiasis or hydroureter.  The urinary bladder is decompressed with trace perivesicular fat stranding.  Stomach/Bowel: Stomach is within normal limits. No evidence of bowel wall thickening or dilatation. Fatty infiltration of the colon wall suggestive of chronic inflammatory changes. Scattered colonic diverticulosis. Appendix appears normal.  Vascular/Lymphatic: No abdominal aorta or iliac aneurysm. At least moderate atherosclerotic plaque of the aorta and its branches. No abdominal, pelvic, or inguinal lymphadenopathy.  Reproductive: Uterus and bilateral adnexa are unremarkable.  Other: No intraperitoneal free fluid. No intraperitoneal free gas. No organized fluid collection.  Musculoskeletal:  No abdominal wall hernia or abnormality.  No suspicious lytic or blastic osseous lesions. No acute displaced fracture. Multilevel degenerative changes of the spine.  IMPRESSION: 1. Obstructive right 7 mm ureterovesicular junction stone. Given trace perivesicular fat stranding, recommend correlation with urinalysis for superimposed infection. 2. Scattered colonic diverticulosis with no acute diverticulitis. 3.  Aortic Atherosclerosis (ICD10-I70.0).   Electronically Signed By: Morgane  Naveau M.D. On: 04/06/2021 00:30   Assessment & Plan:    1. Nephrolithiasis -We discussed the management of kidney stones. These options  include observation, ureteroscopy, shockwave lithotripsy (ESWL) and percutaneous nephrolithotomy (PCNL). We discussed which options are relevant to the patient's stone(s). We discussed the natural history of kidney stones as well as the complications of untreated stones and the impact on quality of life without treatment as well as with each of the above listed treatments. We also discussed the efficacy of each treatment in its ability to clear the stone burden. With any of these management options I discussed the signs and symptoms of infection and the need for emergent treatment should these be experienced. For each option we discussed the ability of each procedure to clear the patient of their stone burden.   For observation I described the risks which include but are not limited   to silent renal damage, life-threatening infection, need for emergent surgery, failure to pass stone and pain.   For ureteroscopy I described the risks which include bleeding, infection, damage to contiguous structures, positioning injury, ureteral stricture, ureteral avulsion, ureteral injury, need for prolonged ureteral stent, inability to perform ureteroscopy, need for an interval procedure, inability to clear stone burden, stent discomfort/pain, heart attack, stroke, pulmonary embolus and the inherent risks with general anesthesia.   For shockwave lithotripsy I described the risks which include arrhythmia, kidney contusion, kidney hemorrhage, need for transfusion, pain, inability to adequately break up stone, inability to pass stone fragments, Steinstrasse, infection associated with obstructing stones, need for alternate surgical procedure, need for repeat shockwave lithotripsy, MI, CVA, PE and the inherent risks with anesthesia/conscious sedation.   For PCNL I described the risks including positioning injury, pneumothorax, hydrothorax, need for chest tube, inability to clear stone burden, renal laceration, arterial venous  fistula or malformation, need for embolization of kidney, loss of kidney or renal function, need for repeat procedure, need for prolonged nephrostomy tube, ureteral avulsion, MI, CVA, PE and the inherent risks of general anesthesia.   - The patient would like to proceed with right ESWL - Urinalysis, Routine w reflex microscopic - DG Abd 1 View   No follow-ups on file.  Chayanne Filippi, MD  [facility]   

## 2023-03-17 NOTE — Progress Notes (Signed)
I spoke with Ashlee Carpenter. We have discussed possible surgery dates and 03/17/2023 was agreed upon by all parties. Patient given information about surgery date, what to expect pre-operatively and post operatively.    We discussed that a pre-op nurse will be calling to set up the pre-op visit that will take place prior to surgery. Informed patient that our office will communicate any additional care to be provided after surgery.    Patients questions or concerns were discussed during our call. Advised to call our office should there be any additional information, questions or concerns that arise. Patient verbalized understanding.

## 2023-03-17 NOTE — Patient Instructions (Addendum)
Dear Ms. Ashlee Carpenter,   Thank you for choosing [facility] [place] to assist in your urologic care for your upcoming surgery. The following information below includes specific dates and details related to surgery:   The Surgical Procedure you are scheduled to have performed is Extracorporeal shock wave lithotripsy    Surgery Date: 03/23/2023   Physician performing the surgery: Dr. Wilkie Aye   Do not eat or drink after midnight the day before your surgery.    You will need a driver the day of surgery and will not be able to operate heavy machinery for 24 hours after.    Your surgery will be performed at  [facility] [address], [place] [address]   Enter at the Main Entrance and check in at the SAME DAY SURGERY desk.     Pre-Admit Testing Info   Pre- Admit appointments are interview with an anesthesiologist or a pre-operative anesthesia nurse. These appointments are typically completed as an in person visit but can take place over the telephone.  You will be contacted to confirm the date and time window.    If you have any questions or concerns, please don't hesitate to call the office at [phone]   Thank you,    Alfredo Martinez, RN Clinical Surgery Coordinator [facility]     ESWL for Kidney Stones  Extracorporeal shock wave lithotripsy (ESWL) is a treatment that can help break up kidney stones that are too large to pass on their own.  This is a nonsurgical procedure that breaks up a kidney stone with shock waves. These shock waves pass through your body and focus on the kidney stone. They cause the kidney stone to break into smaller pieces (fragments) while it is still in the urinary tract. The fragments of stone can pass more easily out of your body in the urine. Tell a health care provider about: Any allergies you have. All medicines you are taking, including vitamins, herbs, eye drops, creams, and over-the-counter  medicines. Any problems you or family members have had with anesthetic medicines. Any bleeding problems you have. Any surgeries you have had. Any medical conditions you have. Whether you are pregnant or may be pregnant. What are the risks? Your health care provider will talk with you about risks. These may include: Infection. Bleeding from the kidney. Bruising of the kidney or skin. Scarring of the kidney. This can lead to: Increased blood pressure. Poor kidney function. Return (recurrence) of kidney stones. Damage to other structures or organs. This may include the liver, colon, spleen, or pancreas. Blockage (obstruction) of the tube that carries urine from the kidney to the bladder (ureter). Failure of the kidney stone to break into fragments. What happens before the procedure? When to stop eating and drinking Follow instructions from your health care provider about what you may eat and drink. These may include: 8 hours before your procedure Stop eating most foods. Do not eat meat, fried foods, or fatty foods. Eat only light foods, such as toast or crackers. All liquids are okay except energy drinks and alcohol. 6 hours before your procedure Stop eating. Drink only clear liquids, such as water, clear fruit juice, black coffee, plain tea, and sports drinks. Do not drink energy drinks or alcohol. 2 hours before your procedure Stop drinking all liquids. You may be allowed to take medicines with small sips of water. If you do not follow your health care provider's instructions, your procedure may be delayed  or canceled. Medicines Ask your health care provider about: Changing or stopping your regular medicines. These include any diabetes medicines or blood thinners you take. Taking medicines such as aspirin and ibuprofen. These medicines can thin your blood. Do not take them unless your health care provider tells you to. Taking over-the-counter medicines, vitamins, herbs, and  supplements. Tests You may have tests, such as: Blood tests. Urine tests. Imaging tests. This may include a CT scan. Surgery safety Ask your health care provider: How your surgery site will be marked. What steps will be taken to help prevent infection. These steps may include: Washing skin with a soap that kills germs. Receiving antibiotics. General instructions If you will be going home right after the procedure, plan to have a responsible adult: Take you home from the hospital or clinic. You will not be allowed to drive. Care for you for the time you are told. What happens during the procedure?  An IV will be inserted into one of your veins. You may be given: A sedative. This helps you relax. Anesthesia. This will: Numb certain areas of your body. Make you fall asleep for surgery. A water-filled cushion may be placed behind your kidney or on your abdomen. In some cases, you may be placed in a tub of lukewarm water. Your body will be positioned in a way that makes it easier to target the kidney stone. An X-ray or ultrasound exam will be done to locate your stone. Shock waves will be aimed at the stone. If you are awake, you may feel a tapping sensation as the shock waves pass through your body. A small mesh tube (stent) may be placed in your ureter. This will help keep urine flowing from the kidney if the fragments of the stone have been blocking the ureter. The stent will be removed at a later time by your health care provider. The procedure may vary among health care providers and hospitals. What happens after the procedure? Your blood pressure, heart rate, breathing rate, and blood oxygen level will be monitored until you leave the hospital or clinic. You may have an X-ray after the procedure to see how many of the kidney stones were broken up. This will also show how much of the stone has passed. If there are still large fragments after treatment, you may need to have a second  procedure at a later time. This information is not intended to replace advice given to you by your health care provider. Make sure you discuss any questions you have with your health care provider. Document Revised: 03/19/2022 Document Reviewed: 03/19/2022 Elsevier Patient Education  2023 [employer].

## 2023-03-18 ENCOUNTER — Telehealth: Payer: Self-pay

## 2023-03-18 NOTE — Telephone Encounter (Signed)
Patient called and advised she is going to need nausea medication.    Pharmacy: [facility] - [place], [place] - [address]   Thank you

## 2023-03-18 NOTE — Telephone Encounter (Signed)
Do you recommend sending in Zofran for pt?  Please see below and advise.

## 2023-03-19 ENCOUNTER — Encounter (HOSPITAL_COMMUNITY)
Admission: RE | Admit: 2023-03-19 | Discharge: 2023-03-19 | Disposition: A | Discharge status: Home / Self Care | Payer: BC Managed Care – PPO | Source: Ambulatory Visit | Attending: Urology | Admitting: Urology

## 2023-03-23 ENCOUNTER — Encounter (HOSPITAL_COMMUNITY)
Admission: RE | Disposition: A | Discharge status: Home / Self Care | Payer: Self-pay | Source: Home / Self Care | Attending: Urology

## 2023-03-23 ENCOUNTER — Ambulatory Visit (HOSPITAL_COMMUNITY)
Admission: RE | Admit: 2023-03-23 | Discharge: 2023-03-23 | Disposition: A | Discharge status: Home / Self Care | Payer: BC Managed Care – PPO | Attending: Urology | Admitting: Urology

## 2023-03-23 ENCOUNTER — Encounter (HOSPITAL_COMMUNITY): Payer: Self-pay | Admitting: Urology

## 2023-03-23 ENCOUNTER — Ambulatory Visit (HOSPITAL_COMMUNITY): Payer: BC Managed Care – PPO

## 2023-03-23 DIAGNOSIS — M5030 Other cervical disc degeneration, unspecified cervical region: Secondary | ICD-10-CM | POA: Diagnosis not present

## 2023-03-23 DIAGNOSIS — N200 Calculus of kidney: Secondary | ICD-10-CM

## 2023-03-23 DIAGNOSIS — F17210 Nicotine dependence, cigarettes, uncomplicated: Secondary | ICD-10-CM | POA: Insufficient documentation

## 2023-03-23 HISTORY — PX: EXTRACORPOREAL SHOCK WAVE LITHOTRIPSY: SHX1557

## 2023-03-23 SURGERY — LITHOTRIPSY, ESWL
Anesthesia: LOCAL | Laterality: Right

## 2023-03-23 MED ORDER — DIPHENHYDRAMINE HCL 25 MG PO CAPS
25.0000 mg | ORAL_CAPSULE | ORAL | Status: AC
  Administered 2023-03-23: 25 mg via ORAL

## 2023-03-23 MED ORDER — DIPHENHYDRAMINE HCL 25 MG PO CAPS
ORAL_CAPSULE | ORAL | Status: AC
  Filled 2023-03-23: qty 1

## 2023-03-23 MED ORDER — TAMSULOSIN HCL 0.4 MG PO CAPS
0.4000 mg | ORAL_CAPSULE | Freq: Every day | ORAL | 0 refills | Status: DC
Start: 2023-03-23 — End: 2023-05-18

## 2023-03-23 MED ORDER — ONDANSETRON HCL 4 MG PO TABS: 4.0000 mg | ORAL_TABLET | Freq: Every day | ORAL | 1 refills | Status: AC | PRN

## 2023-03-23 MED ORDER — OXYCODONE-ACETAMINOPHEN 5-325 MG PO TABS: 1.0000 | ORAL_TABLET | ORAL | 0 refills | Status: AC | PRN

## 2023-03-23 MED ORDER — DIAZEPAM 5 MG PO TABS
10.0000 mg | ORAL_TABLET | Freq: Once | ORAL | Status: AC
  Administered 2023-03-23: 10 mg via ORAL

## 2023-03-23 MED ORDER — SODIUM CHLORIDE 0.9 % IV SOLN: Freq: Once | INTRAVENOUS | Status: AC

## 2023-03-23 MED ORDER — DIAZEPAM 5 MG PO TABS
ORAL_TABLET | ORAL | Status: AC
  Filled 2023-03-23: qty 2

## 2023-03-23 NOTE — Telephone Encounter (Signed)
Called pt to confirm rx is still needed, pt currently in preop.

## 2023-03-23 NOTE — Interval H&P Note (Signed)
History and Physical Interval Note:  03/23/2023 10:17 AM  Ashlee Carpenter  has presented today for surgery, with the diagnosis of right renal calculus.  The various methods of treatment have been discussed with the patient and family. After consideration of risks, benefits and other options for treatment, the patient has consented to  Procedure(s): EXTRACORPOREAL SHOCK WAVE LITHOTRIPSY (ESWL) (Right) as a surgical intervention.  The patient's history has been reviewed, patient examined, no change in status, stable for surgery.  I have reviewed the patient's chart and labs.  Questions were answered to the patient's satisfaction.     Wilkie Aye

## 2023-03-24 ENCOUNTER — Encounter (HOSPITAL_COMMUNITY): Payer: Self-pay | Admitting: Urology

## 2023-03-31 ENCOUNTER — Other Ambulatory Visit: Payer: Self-pay | Admitting: Urology

## 2023-03-31 ENCOUNTER — Ambulatory Visit (HOSPITAL_COMMUNITY)
Admission: RE | Admit: 2023-03-31 | Discharge: 2023-03-31 | Disposition: A | Discharge status: Home / Self Care | Payer: BC Managed Care – PPO | Source: Ambulatory Visit | Attending: Urology | Admitting: Urology

## 2023-03-31 ENCOUNTER — Ambulatory Visit (INDEPENDENT_AMBULATORY_CARE_PROVIDER_SITE_OTHER): Payer: BC Managed Care – PPO | Admitting: Urology

## 2023-03-31 VITALS — BP 153/76 | HR 62

## 2023-03-31 DIAGNOSIS — N200 Calculus of kidney: Secondary | ICD-10-CM

## 2023-03-31 DIAGNOSIS — Z87442 Personal history of urinary calculi: Secondary | ICD-10-CM

## 2023-03-31 DIAGNOSIS — Z09 Encounter for follow-up examination after completed treatment for conditions other than malignant neoplasm: Secondary | ICD-10-CM

## 2023-03-31 LAB — URINALYSIS, ROUTINE W REFLEX MICROSCOPIC
Bilirubin, UA: NEGATIVE
Glucose, UA: NEGATIVE
Ketones, UA: NEGATIVE
Leukocytes,UA: NEGATIVE
Nitrite, UA: NEGATIVE
Protein,UA: NEGATIVE
Specific Gravity, UA: 1.02 (ref 1.005–1.030)
Urobilinogen, Ur: 0.2 mg/dL (ref 0.2–1.0)
pH, UA: 6 (ref 5.0–7.5)

## 2023-03-31 LAB — MICROSCOPIC EXAMINATION: Bacteria, UA: NONE SEEN

## 2023-03-31 NOTE — Progress Notes (Signed)
03/31/2023 1:18 PM   [name] [birth year] [id number]  Referring provider: Babs Sciara, MD [address] [place],  [place] [address]  Followup nephrolithiasis   HPI: Ashlee Carpenter is a 62yo her for followup for nephrolithiasis. She is doing well after Right ESWL. KUB from today does not show any definitive calculi   PMH: Past Medical History:  Diagnosis Date   Acid reflux    Allergy    DDD (degenerative disc disease), cervical    spurs in neck area   Degenerative disc disease, cervical    Degenerative lumbar disc    Diarrhea    with rectal spasms    Gastric hypoacidity    Gastritis    GERD (gastroesophageal reflux disease)    IBS (irritable bowel syndrome)    Psoriasis    Seizures (HCC)    mini seizure in right eye with fragrance exposure -  gets dizzy- last 30 secs and gone- MRI in the past - not ever seen a neurologist - last episode 6-9 months ago     Surgical History: Past Surgical History:  Procedure Laterality Date   CHOLECYSTECTOMY N/A 11/06/2016   Procedure: LAPAROSCOPIC CHOLECYSTECTOMY;  Surgeon: Franky Macho, MD;  Location: [facility];  Service: General;  Laterality: N/A;   COLONOSCOPY     last 2001 Rowan Blase- normal with spastic colon    EXTRACORPOREAL SHOCK WAVE LITHOTRIPSY Right 05/20/2021   Procedure: EXTRACORPOREAL SHOCK WAVE LITHOTRIPSY (ESWL);  Surgeon: Malen Gauze, MD;  Location: [facility];  Service: Urology;  Laterality: Right;   EXTRACORPOREAL SHOCK WAVE LITHOTRIPSY Right 03/23/2023   Procedure: EXTRACORPOREAL SHOCK WAVE LITHOTRIPSY (ESWL);  Surgeon: Malen Gauze, MD;  Location: [facility];  Service: Urology;  Laterality: Right;   TONSILLECTOMY      Home Medications:  Allergies as of 03/31/2023       Reactions   Gluten Meal    Other    Chemicals, fragrances, gas fumes        Medication List        Accurate as of Mar 31, 2023  1:18 PM. If you have any questions, ask your nurse or doctor.          [facility]  DIGESTIVE PROBIOTIC PO Take 1 tablet by mouth at bedtime.   doxycycline 100 MG tablet Commonly known as: VIBRA-TABS Take 1 tablet (100 mg total) by mouth 2 (two) times daily.   HYDROcodone bit-homatropine 5-1.5 MG/5ML syrup Commonly known as: HYCODAN 1 tsp q 6 to 8 hours prn cough only for home use   HYDROcodone-acetaminophen 5-325 MG tablet Commonly known as: Norco Take 1 tablet by mouth every 6 (six) hours as needed for moderate pain.   ibuprofen 200 MG tablet Commonly known as: ADVIL Take 200 mg by mouth every 6 (six) hours as needed.   OIL OF OREGANO PO Take by mouth. Occ use   ondansetron 4 MG tablet Commonly known as: Zofran Take 1 tablet (4 mg total) by mouth daily as needed for nausea or vomiting.   OVER THE COUNTER MEDICATION Take 1 tablet by mouth 3 (three) times daily with meals. Hydrochloric acid prozyme as needed for digestion   OVER THE COUNTER MEDICATION Tumeric curcumin with bioperine 1200mg  daily  Vitamin d3 50 mcg  Vitamin c with calcium and magnesium 500mg   pinellia and magnolia combination herb 600 - 1200mg    Omega 3 2,000 mg  Glucofit 1334 mg   oxyCODONE-acetaminophen 5-325 MG tablet Commonly known as: Percocet Take 1 tablet  by mouth every 4 (four) hours as needed for severe pain.   tamsulosin 0.4 MG Caps capsule Commonly known as: Flomax Take 1 capsule (0.4 mg total) by mouth daily.   VITAMIN D-VITAMIN K PO Take by mouth. Takes this about 3 x a week -- Vitamin D 3 and K2        Allergies:  Allergies  Allergen Reactions   Gluten Meal    Other     Chemicals, fragrances, gas fumes    Family History: Family History  Problem Relation Age of Onset   Colon cancer Mother 25   Colon cancer Maternal Grandmother        died 29 from colon cancer    Breast cancer Paternal Aunt    Colon polyps Neg Hx    Esophageal cancer Neg Hx    Rectal cancer Neg Hx    Stomach cancer Neg Hx     Social History:  reports that she has been smoking  cigarettes. She has been smoking an average of .5 packs per day. She has never used smokeless tobacco. She reports that she does not drink alcohol and does not use drugs.  ROS: All other review of systems were reviewed and are negative except what is noted above in HPI  Physical Exam: BP (!) 153/76   Pulse 62   Constitutional:  Alert and oriented, No acute distress. HEENT: [place] AT, moist mucus membranes.  Trachea midline, no masses. Cardiovascular: No clubbing, cyanosis, or edema. Respiratory: Normal respiratory effort, no increased work of breathing. GI: Abdomen is soft, nontender, nondistended, no abdominal masses GU: No CVA tenderness.  Lymph: No cervical or inguinal lymphadenopathy. Skin: No rashes, bruises or suspicious lesions. Neurologic: Grossly intact, no focal deficits, moving all 4 extremities. Psychiatric: Normal mood and affect.  Laboratory Data: Lab Results  Component Value Date   WBC 10.1 04/06/2021   HGB 13.7 04/06/2021   HCT 42.9 04/06/2021   MCV 91.5 04/06/2021   PLT 227 04/06/2021    Lab Results  Component Value Date   CREATININE 0.83 04/06/2021    No results found for: "PSA"  No results found for: "TESTOSTERONE"  No results found for: "HGBA1C"  Urinalysis    Component Value Date/Time   COLORURINE YELLOW 04/06/2021 0002   APPEARANCEUR Clear 03/17/2023 1411   LABSPEC >1.030 (H) 04/06/2021 0002   PHURINE 5.5 04/06/2021 0002   GLUCOSEU Negative 03/17/2023 1411   HGBUR MODERATE (A) 04/06/2021 0002   BILIRUBINUR Negative 03/17/2023 1411   KETONESUR NEGATIVE 04/06/2021 0002   PROTEINUR Negative 03/17/2023 1411   PROTEINUR NEGATIVE 04/06/2021 0002   NITRITE Negative 03/17/2023 1411   NITRITE NEGATIVE 04/06/2021 0002   LEUKOCYTESUR Negative 03/17/2023 1411   LEUKOCYTESUR NEGATIVE 04/06/2021 0002    Lab Results  Component Value Date   LABMICR See below: 03/17/2023   WBCUA 0-5 03/17/2023   LABEPIT 0-10 03/17/2023   MUCUS Present 05/13/2021    BACTERIA None seen 03/17/2023    Pertinent Imaging: KUb today: Images reviewed and discussed with the patient  Results for orders placed during the hospital encounter of 03/23/23  DG Abd 1 View  Narrative CLINICAL DATA:  Right nephrolithiasis.  EXAM: ABDOMEN - 1 VIEW  COMPARISON:  March 17, 2023.  FINDINGS: The bowel gas pattern is normal. Status post cholecystectomy. Stable 2 right renal calculi are noted projected over lower pole of right kidney.  IMPRESSION: Stable right nephrolithiasis.   Electronically Signed By: Lupita Raider M.D. On: 03/23/2023 10:26  No results  found for this or any previous visit.  No results found for this or any previous visit.  No results found for this or any previous visit.  Results for orders placed during the hospital encounter of 06/23/22  Ultrasound renal complete  Narrative CLINICAL DATA:  Nephrolithiasis follow-up.  Post lithotripsy.  EXAM: RENAL / URINARY TRACT ULTRASOUND COMPLETE  COMPARISON:  CT of the abdomen Apr 06, 2021  FINDINGS: Right Kidney:  Renal measurements: 9.7 x 4.9 x 6.3 cm = volume: 158 mL. Echogenicity within normal limits. No mass or hydronephrosis visualized.  Left Kidney:  Renal measurements: 11.0 x 6.0 x 4.7 cm = volume: 162 mL. Echogenicity within normal limits. No mass or hydronephrosis visualized.  Bladder:  Appears normal for degree of bladder distention. Bilateral ureteral jets seen.  Other:  None.  IMPRESSION: Normal renal ultrasound.   Electronically Signed By: Ted Mcalpine M.D. On: 06/23/2022 16:17  No valid procedures specified. No results found for this or any previous visit.  Results for orders placed during the hospital encounter of 04/05/21  CT Renal Stone Study  Narrative CLINICAL DATA:  Flank pain. Lower back pain. Right side. Dribbling of urine. History of IBS and cholecystectomy.  EXAM: CT ABDOMEN AND PELVIS WITHOUT  CONTRAST  TECHNIQUE: Multidetector CT imaging of the abdomen and pelvis was performed following the standard protocol without IV contrast.  COMPARISON:  CT abdomen pelvis 11/05/2016  FINDINGS: Lower chest: No acute abnormality.  Hepatobiliary: No focal liver abnormality is seen. Status post cholecystectomy. No biliary dilatation.  Pancreas: No focal lesion. Normal pancreatic contour. No surrounding inflammatory changes. No main pancreatic ductal dilatation.  Spleen: Normal in size without focal abnormality.  Adrenals/Urinary Tract:  No adrenal nodule bilaterally.  No left hydronephrosis. Interval development of at least moderate right hydronephrosis as well as at least moderate right hydroureter. Distally there is an associated 7 mm calcified stone at the right ureterovesicular junction. No nephrolithiasis and no contour-deforming renal mass. No ureterolithiasis or hydroureter.  The urinary bladder is decompressed with trace perivesicular fat stranding.  Stomach/Bowel: Stomach is within normal limits. No evidence of bowel wall thickening or dilatation. Fatty infiltration of the colon wall suggestive of chronic inflammatory changes. Scattered colonic diverticulosis. Appendix appears normal.  Vascular/Lymphatic: No abdominal aorta or iliac aneurysm. At least moderate atherosclerotic plaque of the aorta and its branches. No abdominal, pelvic, or inguinal lymphadenopathy.  Reproductive: Uterus and bilateral adnexa are unremarkable.  Other: No intraperitoneal free fluid. No intraperitoneal free gas. No organized fluid collection.  Musculoskeletal:  No abdominal wall hernia or abnormality.  No suspicious lytic or blastic osseous lesions. No acute displaced fracture. Multilevel degenerative changes of the spine.  IMPRESSION: 1. Obstructive right 7 mm ureterovesicular junction stone. Given trace perivesicular fat stranding, recommend correlation with urinalysis for  superimposed infection. 2. Scattered colonic diverticulosis with no acute diverticulitis. 3.  Aortic Atherosclerosis (ICD10-I70.0).   Electronically Signed By: Tish Frederickson M.D. On: 04/06/2021 00:30   Assessment & Plan:    1. Nephrolithiasis Followup 3-4 weeks with KUB - Urinalysis, Routine w reflex microscopic   No follow-ups on file.  Wilkie Aye, MD  [facility]

## 2023-04-13 IMAGING — DX DG ABDOMEN 1V
2 series · 2 of 2 positions shown · non-contrast
Comparison: Radiograph dated 06/16/2021 and CT dated 04/06/2021.

CLINICAL DATA: Nephrolithiasis.

EXAM:
ABDOMEN - 1 VIEW

[abdomen kub (1 of 2)]
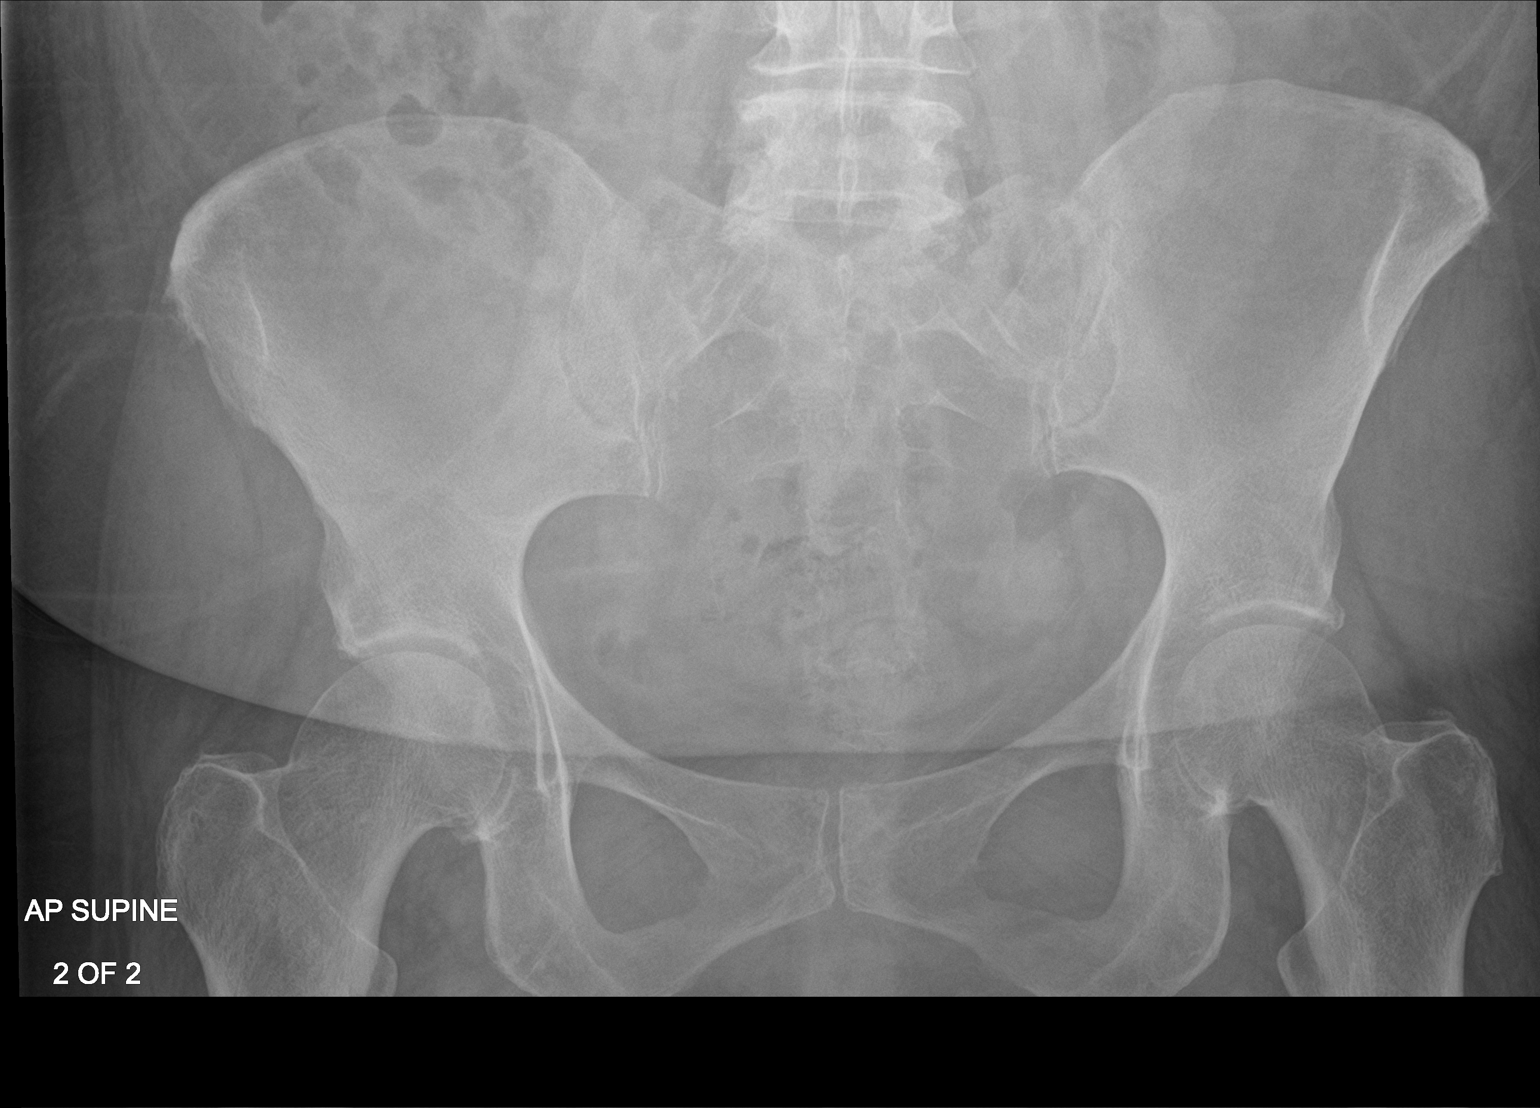

[abdomen kub (2 of 2)]
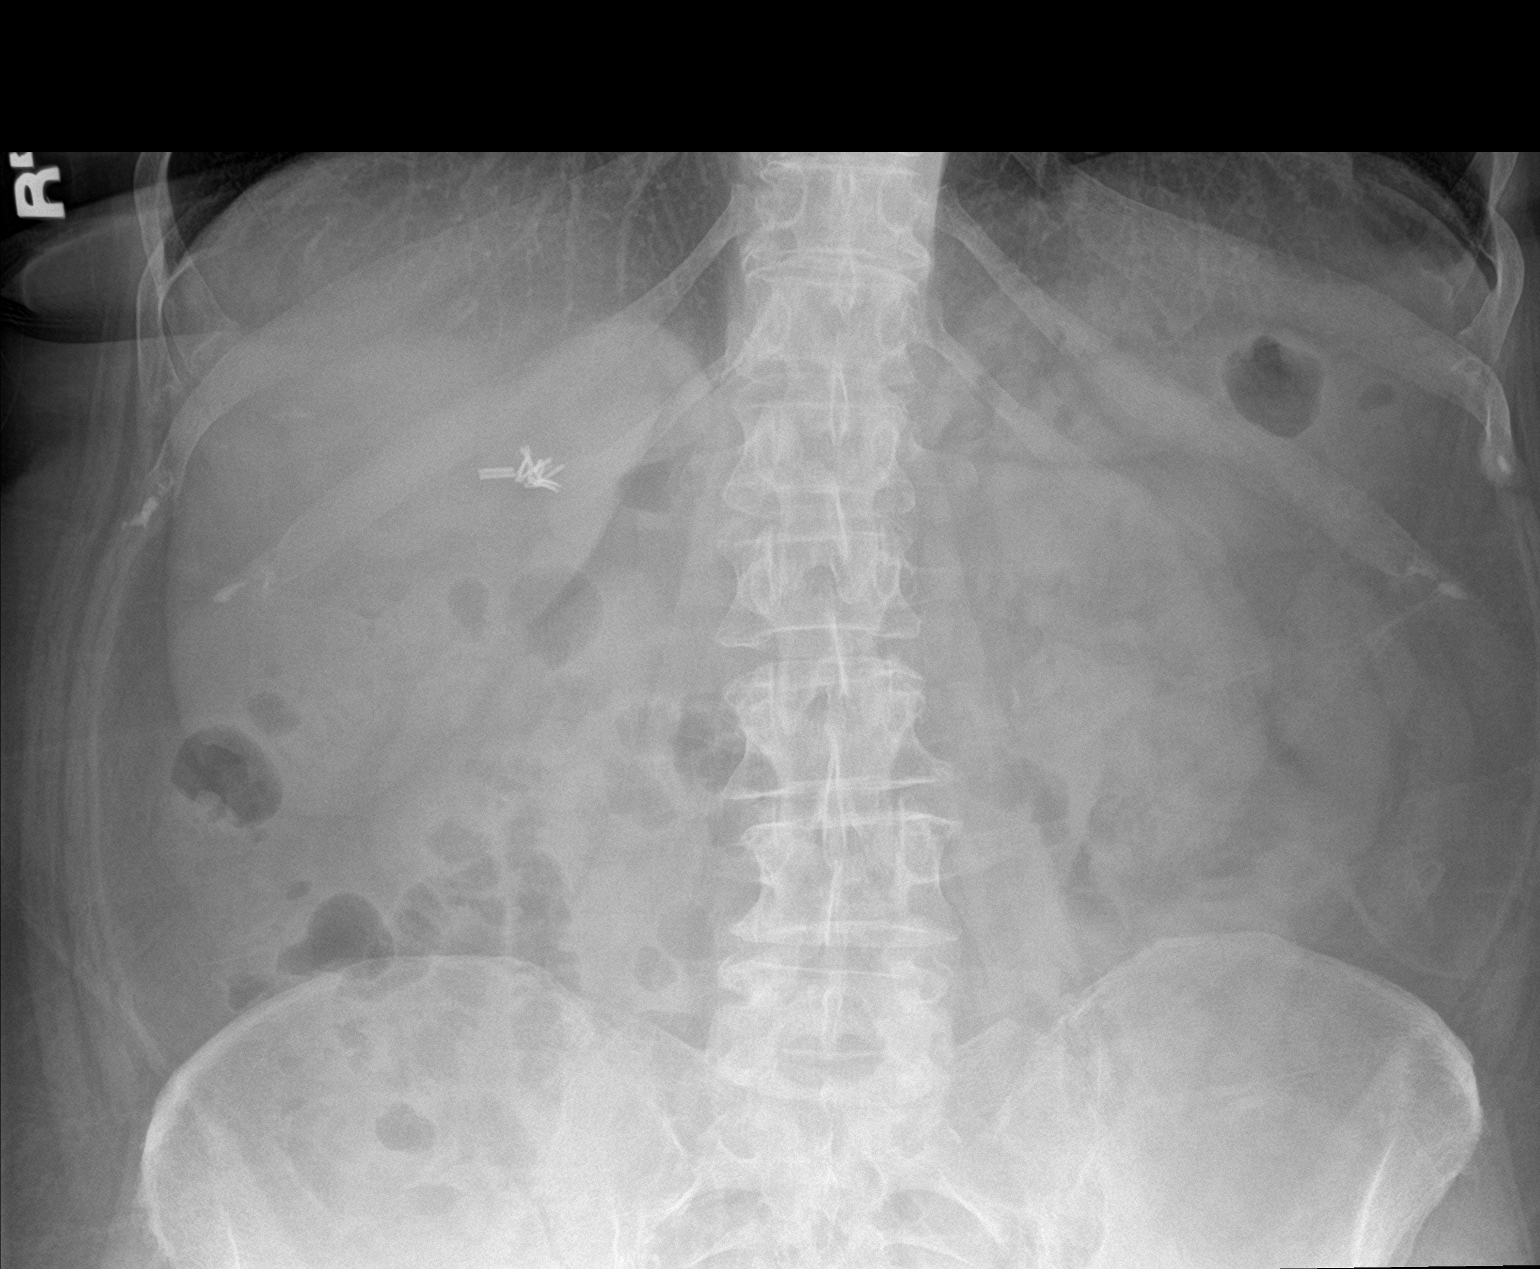

[2 of 2 positions shown; findings below may reference images not displayed]

FINDINGS: No radiopaque calculi noted over the renal silhouette or within the
pelvis. Right upper quadrant cholecystectomy clips. There is no
bowel dilatation or evidence of obstruction. No free air. No acute
osseous pathology.
IMPRESSION: Negative.

## 2023-05-18 ENCOUNTER — Encounter: Payer: Self-pay | Admitting: Urology

## 2023-05-18 ENCOUNTER — Ambulatory Visit: Payer: BC Managed Care – PPO | Admitting: Urology

## 2023-05-18 ENCOUNTER — Ambulatory Visit (HOSPITAL_COMMUNITY)
Admission: RE | Admit: 2023-05-18 | Discharge: 2023-05-18 | Disposition: A | Discharge status: Home / Self Care | Payer: BC Managed Care – PPO | Source: Ambulatory Visit | Attending: Urology | Admitting: Urology

## 2023-05-18 VITALS — BP 135/83 | HR 69

## 2023-05-18 DIAGNOSIS — N200 Calculus of kidney: Secondary | ICD-10-CM | POA: Diagnosis not present

## 2023-05-18 NOTE — Patient Instructions (Signed)

## 2023-05-18 NOTE — Progress Notes (Signed)
05/18/2023 2:24 PM   Ashlee Carpenter September 09, 1961 [id number]  Referring provider: Babs Sciara, MD [address] [place],  [place] [address]  Followup nephrolithiaiss   HPI: [name] is a 62yo here for followup for nephrolithiasis. No stone events since last visit. She denies any flank pain. KUB from today shows no definitive calculus   PMH: Past Medical History:  Diagnosis Date   Acid reflux    Allergy    DDD (degenerative disc disease), cervical    spurs in neck area   Degenerative disc disease, cervical    Degenerative lumbar disc    Diarrhea    with rectal spasms    Gastric hypoacidity    Gastritis    GERD (gastroesophageal reflux disease)    IBS (irritable bowel syndrome)    Psoriasis    Seizures (HCC)    mini seizure in right eye with fragrance exposure -  gets dizzy- last 30 secs and gone- MRI in the past - not ever seen a neurologist - last episode 6-9 months ago     Surgical History: Past Surgical History:  Procedure Laterality Date   CHOLECYSTECTOMY N/A 11/06/2016   Procedure: LAPAROSCOPIC CHOLECYSTECTOMY;  Surgeon: Franky Macho, MD;  Location: [facility];  Service: General;  Laterality: N/A;   COLONOSCOPY     last 2001 Rowan Blase- normal with spastic colon    EXTRACORPOREAL SHOCK WAVE LITHOTRIPSY Right 05/20/2021   Procedure: EXTRACORPOREAL SHOCK WAVE LITHOTRIPSY (ESWL);  Surgeon: Malen Gauze, MD;  Location: [facility];  Service: Urology;  Laterality: Right;   EXTRACORPOREAL SHOCK WAVE LITHOTRIPSY Right 03/23/2023   Procedure: EXTRACORPOREAL SHOCK WAVE LITHOTRIPSY (ESWL);  Surgeon: Malen Gauze, MD;  Location: [facility];  Service: Urology;  Laterality: Right;   TONSILLECTOMY      Home Medications:  Allergies as of 05/18/2023       Reactions   Gluten Meal    Other    Chemicals, fragrances, gas fumes        Medication List        Accurate as of May 18, 2023  2:24 PM. If you have any questions, ask your nurse or doctor.           STOP taking these medications    doxycycline 100 MG tablet Commonly known as: VIBRA-TABS   HYDROcodone bit-homatropine 5-1.5 MG/5ML syrup Commonly known as: HYCODAN   tamsulosin 0.4 MG Caps capsule Commonly known as: Flomax       TAKE these medications    [employer]S DIGESTIVE PROBIOTIC PO Take 1 tablet by mouth at bedtime.   HYDROcodone-acetaminophen 5-325 MG tablet Commonly known as: Norco Take 1 tablet by mouth every 6 (six) hours as needed for moderate pain.   ibuprofen 200 MG tablet Commonly known as: ADVIL Take 200 mg by mouth every 6 (six) hours as needed.   OIL OF OREGANO PO Take by mouth. Occ use   ondansetron 4 MG tablet Commonly known as: Zofran Take 1 tablet (4 mg total) by mouth daily as needed for nausea or vomiting.   OVER THE COUNTER MEDICATION Take 1 tablet by mouth 3 (three) times daily with meals. Hydrochloric acid prozyme as needed for digestion   OVER THE COUNTER MEDICATION Tumeric curcumin with bioperine 1200mg  daily  Vitamin d3 50 mcg  Vitamin c with calcium and magnesium 500mg   pinellia and magnolia combination herb 600 - 1200mg    Omega 3 2,000 mg  Glucofit 1334 mg   oxyCODONE-acetaminophen 5-325 MG tablet Commonly known  as: Percocet Take 1 tablet by mouth every 4 (four) hours as needed for severe pain.   VITAMIN D-VITAMIN K PO Take by mouth. Takes this about 3 x a week -- Vitamin D 3 and K2        Allergies:  Allergies  Allergen Reactions   Gluten Meal    Other     Chemicals, fragrances, gas fumes    Family History: Family History  Problem Relation Age of Onset   Colon cancer Mother 11   Colon cancer Maternal Grandmother        died 47 from colon cancer    Breast cancer Paternal Aunt    Colon polyps Neg Hx    Esophageal cancer Neg Hx    Rectal cancer Neg Hx    Stomach cancer Neg Hx     Social History:  reports that she has been smoking cigarettes. She has been smoking an average of .5 packs per day. She has  never used smokeless tobacco. She reports that she does not drink alcohol and does not use drugs.  ROS: All other review of systems were reviewed and are negative except what is noted above in HPI  Physical Exam: BP 135/83   Pulse 69   Constitutional:  Alert and oriented, No acute distress. HEENT: [place] AT, moist mucus membranes.  Trachea midline, no masses. Cardiovascular: No clubbing, cyanosis, or edema. Respiratory: Normal respiratory effort, no increased work of breathing. GI: Abdomen is soft, nontender, nondistended, no abdominal masses GU: No CVA tenderness.  Lymph: No cervical or inguinal lymphadenopathy. Skin: No rashes, bruises or suspicious lesions. Neurologic: Grossly intact, no focal deficits, moving all 4 extremities. Psychiatric: Normal mood and affect.  Laboratory Data: Lab Results  Component Value Date   WBC 10.1 04/06/2021   HGB 13.7 04/06/2021   HCT 42.9 04/06/2021   MCV 91.5 04/06/2021   PLT 227 04/06/2021    Lab Results  Component Value Date   CREATININE 0.83 04/06/2021    No results found for: "PSA"  No results found for: "TESTOSTERONE"  No results found for: "HGBA1C"  Urinalysis    Component Value Date/Time   COLORURINE YELLOW 04/06/2021 0002   APPEARANCEUR Clear 03/31/2023 1317   LABSPEC >1.030 (H) 04/06/2021 0002   PHURINE 5.5 04/06/2021 0002   GLUCOSEU Negative 03/31/2023 1317   HGBUR MODERATE (A) 04/06/2021 0002   BILIRUBINUR Negative 03/31/2023 1317   KETONESUR NEGATIVE 04/06/2021 0002   PROTEINUR Negative 03/31/2023 1317   PROTEINUR NEGATIVE 04/06/2021 0002   NITRITE Negative 03/31/2023 1317   NITRITE NEGATIVE 04/06/2021 0002   LEUKOCYTESUR Negative 03/31/2023 1317   LEUKOCYTESUR NEGATIVE 04/06/2021 0002    Lab Results  Component Value Date   LABMICR See below: 03/31/2023   WBCUA 0-5 03/31/2023   LABEPIT 0-10 03/31/2023   MUCUS Present (A) 03/31/2023   BACTERIA None seen 03/31/2023    Pertinent Imaging: KUb today: Images  reviewed and discussed with the patient  Results for orders placed in visit on 03/31/23  Abdomen 1 view (KUB)  Narrative CLINICAL DATA:  Kidney stones  EXAM: ABDOMEN - 1 VIEW  COMPARISON:  03/23/2023  FINDINGS: 6 mm calculus projects over mid RIGHT kidney.  Previously identified calculus at inferior pole RIGHT kidney no longer visualized.  No definite LEFT renal calculi.  No ureteral calculi seen.  Bowel gas pattern normal.  Surgical clips RIGHT upper quadrant likely reflect prior cholecystectomy.  Bones demineralized.  IMPRESSION: 6 mm RIGHT renal calculus.  Additional tiny calculus projecting over the  inferior pole the RIGHT kidney on the previous exam no longer seen.   Electronically Signed By: Ulyses Southward M.D. On: 04/01/2023 15:25  No results found for this or any previous visit.  No results found for this or any previous visit.  No results found for this or any previous visit.  Results for orders placed during the hospital encounter of 06/23/22  Ultrasound renal complete  Narrative CLINICAL DATA:  Nephrolithiasis follow-up.  Post lithotripsy.  EXAM: RENAL / URINARY TRACT ULTRASOUND COMPLETE  COMPARISON:  CT of the abdomen Apr 06, 2021  FINDINGS: Right Kidney:  Renal measurements: 9.7 x 4.9 x 6.3 cm = volume: 158 mL. Echogenicity within normal limits. No mass or hydronephrosis visualized.  Left Kidney:  Renal measurements: 11.0 x 6.0 x 4.7 cm = volume: 162 mL. Echogenicity within normal limits. No mass or hydronephrosis visualized.  Bladder:  Appears normal for degree of bladder distention. Bilateral ureteral jets seen.  Other:  None.  IMPRESSION: Normal renal ultrasound.   Electronically Signed By: Ted Mcalpine M.D. On: 06/23/2022 16:17  No valid procedures specified. No results found for this or any previous visit.  Results for orders placed during the hospital encounter of 04/05/21  CT Renal Stone  Study  Narrative CLINICAL DATA:  Flank pain. Lower back pain. Right side. Dribbling of urine. History of IBS and cholecystectomy.  EXAM: CT ABDOMEN AND PELVIS WITHOUT CONTRAST  TECHNIQUE: Multidetector CT imaging of the abdomen and pelvis was performed following the standard protocol without IV contrast.  COMPARISON:  CT abdomen pelvis 11/05/2016  FINDINGS: Lower chest: No acute abnormality.  Hepatobiliary: No focal liver abnormality is seen. Status post cholecystectomy. No biliary dilatation.  Pancreas: No focal lesion. Normal pancreatic contour. No surrounding inflammatory changes. No main pancreatic ductal dilatation.  Spleen: Normal in size without focal abnormality.  Adrenals/Urinary Tract:  No adrenal nodule bilaterally.  No left hydronephrosis. Interval development of at least moderate right hydronephrosis as well as at least moderate right hydroureter. Distally there is an associated 7 mm calcified stone at the right ureterovesicular junction. No nephrolithiasis and no contour-deforming renal mass. No ureterolithiasis or hydroureter.  The urinary bladder is decompressed with trace perivesicular fat stranding.  Stomach/Bowel: Stomach is within normal limits. No evidence of bowel wall thickening or dilatation. Fatty infiltration of the colon wall suggestive of chronic inflammatory changes. Scattered colonic diverticulosis. Appendix appears normal.  Vascular/Lymphatic: No abdominal aorta or iliac aneurysm. At least moderate atherosclerotic plaque of the aorta and its branches. No abdominal, pelvic, or inguinal lymphadenopathy.  Reproductive: Uterus and bilateral adnexa are unremarkable.  Other: No intraperitoneal free fluid. No intraperitoneal free gas. No organized fluid collection.  Musculoskeletal:  No abdominal wall hernia or abnormality.  No suspicious lytic or blastic osseous lesions. No acute displaced fracture. Multilevel degenerative changes of  the spine.  IMPRESSION: 1. Obstructive right 7 mm ureterovesicular junction stone. Given trace perivesicular fat stranding, recommend correlation with urinalysis for superimposed infection. 2. Scattered colonic diverticulosis with no acute diverticulitis. 3.  Aortic Atherosclerosis (ICD10-I70.0).   Electronically Signed By: Tish Frederickson M.D. On: 04/06/2021 00:30   Assessment & Plan:    1. Nephrolithiasis Followup 3 months with renal US -dietary handout given  - Urinalysis, Routine w reflex microscopic   No follow-ups on file.  Wilkie Aye, MD  [facility] [place]

## 2023-05-19 LAB — URINALYSIS, ROUTINE W REFLEX MICROSCOPIC
Bilirubin, UA: NEGATIVE
Glucose, UA: NEGATIVE
Ketones, UA: NEGATIVE
Leukocytes,UA: NEGATIVE
Nitrite, UA: NEGATIVE
Protein,UA: NEGATIVE
RBC, UA: NEGATIVE
Specific Gravity, UA: 1.025 (ref 1.005–1.030)
Urobilinogen, Ur: 0.2 mg/dL (ref 0.2–1.0)
pH, UA: 6 (ref 5.0–7.5)

## 2023-05-25 ENCOUNTER — Encounter: Payer: Self-pay | Admitting: Urology

## 2023-07-12 ENCOUNTER — Ambulatory Visit (HOSPITAL_COMMUNITY)
Admission: RE | Admit: 2023-07-12 | Discharge: 2023-07-12 | Disposition: A | Discharge status: Home / Self Care | Payer: BC Managed Care – PPO | Source: Ambulatory Visit | Attending: Urology | Admitting: Urology

## 2023-07-12 DIAGNOSIS — N200 Calculus of kidney: Secondary | ICD-10-CM | POA: Diagnosis present

## 2023-08-23 ENCOUNTER — Ambulatory Visit: Payer: BC Managed Care – PPO | Admitting: Urology

## 2023-08-23 VITALS — BP 142/66 | HR 65

## 2023-08-23 DIAGNOSIS — N200 Calculus of kidney: Secondary | ICD-10-CM | POA: Diagnosis not present

## 2023-08-23 LAB — URINALYSIS, ROUTINE W REFLEX MICROSCOPIC
Bilirubin, UA: NEGATIVE
Glucose, UA: NEGATIVE
Ketones, UA: NEGATIVE
Leukocytes,UA: NEGATIVE
Nitrite, UA: NEGATIVE
Protein,UA: NEGATIVE
RBC, UA: NEGATIVE
Specific Gravity, UA: 1.025 (ref 1.005–1.030)
Urobilinogen, Ur: 0.2 mg/dL (ref 0.2–1.0)
pH, UA: 6 (ref 5.0–7.5)

## 2023-08-23 NOTE — Progress Notes (Unsigned)
08/23/2023 11:39 AM   [name] 1961-01-01 [id number]  Referring provider: Babs Sciara, MD [address] [place],  [place] [address]  Followup nephrolithiasis   HPI: Ashlee Carpenter is a 62yo here for followup for nephrolithiasis. Renal US shows possible right lower pole fragments. She has intermittent left flank pain. She has passed a couple of small fragments. No flank pain currently    PMH: Past Medical History:  Diagnosis Date   Acid reflux    Allergy    DDD (degenerative disc disease), cervical    spurs in neck area   Degenerative disc disease, cervical    Degenerative lumbar disc    Diarrhea    with rectal spasms    Gastric hypoacidity    Gastritis    GERD (gastroesophageal reflux disease)    IBS (irritable bowel syndrome)    Psoriasis    Seizures (HCC)    mini seizure in right eye with fragrance exposure -  gets dizzy- last 30 secs and gone- MRI in the past - not ever seen a neurologist - last episode 6-9 months ago     Surgical History: Past Surgical History:  Procedure Laterality Date   CHOLECYSTECTOMY N/A 11/06/2016   Procedure: LAPAROSCOPIC CHOLECYSTECTOMY;  Surgeon: Franky Macho, MD;  Location: [facility];  Service: General;  Laterality: N/A;   COLONOSCOPY     last 2001 Row[facility] Blase- normal with spastic colon    EXTRACORPOREAL SHOCK WAVE LITHOTRIPSY Right 05/20/2021   Procedure: EXTRACORPOREAL SHOCK WAVE LITHOTRIPSY (ESWL);  Surgeon: Malen Gauze, MD;  Location: [facility];  Service: Urology;  Laterality: Right;   EXTRACORPOREAL SHOCK WAVE LITHOTRIPSY Right 03/23/2023   Procedure: EXTRACORPOREAL SHOCK WAVE LITHOTRIPSY (ESWL);  Surgeon: Malen Gauze, MD;  Location: [facility];  Service: Urology;  Laterality: Right;   TONSILLECTOMY      Home Medications:  Allergies as of 08/23/2023       Reactions   Gluten Meal    Other    Chemicals, fragrances, gas fumes        Medication List        Accurate as of August 23, 2023 11:39 AM.  If you have any questions, ask your nurse or doctor.          [employer] DIGESTIVE PROBIOTIC PO Take 1 tablet by mouth at bedtime.   HYDROcodone-acetaminophen 5-325 MG tablet Commonly known as: Norco Take 1 tablet by mouth every 6 (six) hours as needed for moderate pain.   ibuprofen 200 MG tablet Commonly known as: ADVIL Take 200 mg by mouth every 6 (six) hours as needed.   OIL OF OREGANO PO Take by mouth. Occ use   ondansetron 4 MG tablet Commonly known as: Zofran Take 1 tablet (4 mg total) by mouth daily as needed for nausea or vomiting.   OVER THE COUNTER MEDICATION Take 1 tablet by mouth 3 (three) times daily with meals. Hydrochloric acid prozyme as needed for digestion   OVER THE COUNTER MEDICATION Tumeric curcumin with bioperine 1200mg  daily  Vitamin d3 50 mcg  Vitamin c with calcium and magnesium 500mg   pinellia and magnolia combination herb 600 - 1200mg    Omega 3 2,000 mg  Glucofit 1334 mg   oxyCODONE-acetaminophen 5-325 MG tablet Commonly known as: Percocet Take 1 tablet by mouth every 4 (four) hours as needed for severe pain.   VITAMIN D-VITAMIN K PO Take by mouth. Takes this about 3 x a week -- Vitamin D 3 and K2  Allergies:  Allergies  Allergen Reactions   Gluten Meal    Other     Chemicals, fragrances, gas fumes    Family History: Family History  Problem Relation Age of Onset   Colon cancer Mother 13   Colon cancer Maternal Grandmother        died 66 from colon cancer    Breast cancer Paternal Aunt    Colon polyps Neg Hx    Esophageal cancer Neg Hx    Rectal cancer Neg Hx    Stomach cancer Neg Hx     Social History:  reports that she has been smoking cigarettes. She has never used smokeless tobacco. She reports that she does not drink alcohol and does not use drugs.  ROS: All other review of systems were reviewed and are negative except what is noted above in HPI  Physical Exam: BP (!) 142/66   Pulse 65   Constitutional:   Alert and oriented, No acute distress. HEENT: [place] AT, moist mucus membranes.  Trachea midline, no masses. Cardiovascular: No clubbing, cyanosis, or edema. Respiratory: Normal respiratory effort, no increased work of breathing. GI: Abdomen is soft, nontender, nondistended, no abdominal masses GU: No CVA tenderness.  Lymph: No cervical or inguinal lymphadenopathy. Skin: No rashes, bruises or suspicious lesions. Neurologic: Grossly intact, no focal deficits, moving all 4 extremities. Psychiatric: Normal mood and affect.  Laboratory Data: Lab Results  Component Value Date   WBC 10.1 04/06/2021   HGB 13.7 04/06/2021   HCT 42.9 04/06/2021   MCV 91.5 04/06/2021   PLT 227 04/06/2021    Lab Results  Component Value Date   CREATININE 0.83 04/06/2021    No results found for: "PSA"  No results found for: "TESTOSTERONE"  No results found for: "HGBA1C"  Urinalysis    Component Value Date/Time   COLORURINE YELLOW 04/06/2021 0002   APPEARANCEUR Clear 05/18/2023 1419   LABSPEC >1.030 (H) 04/06/2021 0002   PHURINE 5.5 04/06/2021 0002   GLUCOSEU Negative 05/18/2023 1419   HGBUR MODERATE (A) 04/06/2021 0002   BILIRUBINUR Negative 05/18/2023 1419   KETONESUR NEGATIVE 04/06/2021 0002   PROTEINUR Negative 05/18/2023 1419   PROTEINUR NEGATIVE 04/06/2021 0002   NITRITE Negative 05/18/2023 1419   NITRITE NEGATIVE 04/06/2021 0002   LEUKOCYTESUR Negative 05/18/2023 1419   LEUKOCYTESUR NEGATIVE 04/06/2021 0002    Lab Results  Component Value Date   LABMICR Comment 05/18/2023   WBCUA 0-5 03/31/2023   LABEPIT 0-10 03/31/2023   MUCUS Present (A) 03/31/2023   BACTERIA None seen 03/31/2023    Pertinent Imaging: Renal US 07/12/2023: Images reviewed and discussed with the patient  Results for orders placed during the hospital encounter of 05/18/23  Abdomen 1 view (KUB)  Narrative CLINICAL DATA:  Nephrolithiasis.  EXAM: ABDOMEN - 1 VIEW  COMPARISON:  Radiographs  03/31/2023  FINDINGS: The previous calculus projecting over the mid right kidney on 03/31/2023 is not definitively visualized. Nonobstructive bowel-gas pattern. Cholecystectomy. No acute osseous abnormality.  IMPRESSION: No definite nephrolithiasis.   Electronically Signed By: Minerva Fester M.D. On: 05/22/2023 21:27  No results found for this or any previous visit.  No results found for this or any previous visit.  No results found for this or any previous visit.  Results for orders placed during the hospital encounter of 07/12/23  Ultrasound renal complete  Narrative CLINICAL DATA:  Nephrolithiasis.  EXAM: RENAL / URINARY TRACT ULTRASOUND COMPLETE  COMPARISON:  CT renal stone 04/05/2021  FINDINGS: Right Kidney:  Renal measurements: 8.9 x 4.5  x 5.5 cm = volume: 115.6 mL. Normal renal cortical thickness and echogenicity. Multiple renal stones are demonstrated including a 6 mm stone midpole. No hydronephrosis.  Left Kidney:  Renal measurements: 10.3 x 5.9 x 4.4 cm = volume: 140.1 mL. Echogenicity within normal limits. No mass or hydronephrosis visualized.  Bladder:  Appears normal for degree of bladder distention.  Other:  None.  IMPRESSION: Right nephrolithiasis. No hydronephrosis.   Electronically Signed By: Annia Belt M.D. On: 07/12/2023 16:17  No valid procedures specified. No results found for this or any previous visit.  Results for orders placed during the hospital encounter of 04/05/21  CT Renal Stone Study  Narrative CLINICAL DATA:  Flank pain. Lower back pain. Right side. Dribbling of urine. History of IBS and cholecystectomy.  EXAM: CT ABDOMEN AND PELVIS WITHOUT CONTRAST  TECHNIQUE: Multidetector CT imaging of the abdomen and pelvis was performed following the standard protocol without IV contrast.  COMPARISON:  CT abdomen pelvis 11/05/2016  FINDINGS: Lower chest: No acute abnormality.  Hepatobiliary: No focal liver  abnormality is seen. Status post cholecystectomy. No biliary dilatation.  Pancreas: No focal lesion. Normal pancreatic contour. No surrounding inflammatory changes. No main pancreatic ductal dilatation.  Spleen: Normal in size without focal abnormality.  Adrenals/Urinary Tract:  No adrenal nodule bilaterally.  No left hydronephrosis. Interval development of at least moderate right hydronephrosis as well as at least moderate right hydroureter. Distally there is an associated 7 mm calcified stone at the right ureterovesicular junction. No nephrolithiasis and no contour-deforming renal mass. No ureterolithiasis or hydroureter.  The urinary bladder is decompressed with trace perivesicular fat stranding.  Stomach/Bowel: Stomach is within normal limits. No evidence of bowel wall thickening or dilatation. Fatty infiltration of the colon wall suggestive of chronic inflammatory changes. Scattered colonic diverticulosis. Appendix appears normal.  Vascular/Lymphatic: No abdominal aorta or iliac aneurysm. At least moderate atherosclerotic plaque of the aorta and its branches. No abdominal, pelvic, or inguinal lymphadenopathy.  Reproductive: Uterus and bilateral adnexa are unremarkable.  Other: No intraperitoneal free fluid. No intraperitoneal free gas. No organized fluid collection.  Musculoskeletal:  No abdominal wall hernia or abnormality.  No suspicious lytic or blastic osseous lesions. No acute displaced fracture. Multilevel degenerative changes of the spine.  IMPRESSION: 1. Obstructive right 7 mm ureterovesicular junction stone. Given trace perivesicular fat stranding, recommend correlation with urinalysis for superimposed infection. 2. Scattered colonic diverticulosis with no acute diverticulitis. 3.  Aortic Atherosclerosis (ICD10-I70.0).   Electronically Signed By: Tish Frederickson M.D. On: 04/06/2021 00:30   Assessment & Plan:    1. Nephrolithiasis -followup 3  months with a KUB - Urinalysis, Routine w reflex microscopic   No follow-ups on file.  Wilkie Aye, MD  [facility]

## 2023-08-24 ENCOUNTER — Encounter: Payer: Self-pay | Admitting: Urology

## 2023-08-24 NOTE — Patient Instructions (Signed)

## 2023-09-12 ENCOUNTER — Encounter: Payer: Self-pay | Admitting: Family Medicine

## 2023-09-13 NOTE — Telephone Encounter (Signed)
Ashlee Carpenter I have previously discussed similar cases with infectious disease specialist.  They have told me that in these type of situations they recommend for patients to do ova and parasite specimen through a qualified human lab such as [facility] and if positive results then treat or refer to infectious disease.  Also if patient is significantly concerned about this I would recommend infectious disease consultation thank you

## 2023-09-15 ENCOUNTER — Other Ambulatory Visit: Payer: Self-pay | Admitting: Nurse Practitioner

## 2023-09-15 DIAGNOSIS — R855 Abnormal microbiological findings in specimens from digestive organs and abdominal cavity: Secondary | ICD-10-CM

## 2023-09-21 ENCOUNTER — Encounter: Payer: Self-pay | Admitting: Internal Medicine

## 2023-09-21 ENCOUNTER — Other Ambulatory Visit: Payer: Self-pay

## 2023-09-21 ENCOUNTER — Ambulatory Visit: Payer: BC Managed Care – PPO | Admitting: Internal Medicine

## 2023-09-21 VITALS — BP 161/96 | HR 74 | Temp 97.5°F | Resp 16 | Wt 183.0 lb

## 2023-09-21 DIAGNOSIS — R197 Diarrhea, unspecified: Secondary | ICD-10-CM | POA: Insufficient documentation

## 2023-09-21 NOTE — Progress Notes (Signed)
[facility]      Reason for Consult: concern for abdominal parasites    Referring Physician: Dr. Gerda Diss    Patient ID: Ashlee Carpenter, female    DOB: [birth year], 62 y.o.   MRN: [id number]  HPI:   Ashlee Carpenter is here for evaluation for concern for parasites.   She was seen by a veterinarian in [place] for her abdominal issues and was told she has parasites and oocytes in her stool.  A hand written report was reviewed by this veterinarian.  She is having loose stools but typical of her baseline.  She has not traveled internationally recently.  No weight loss.    Past Medical History:  Diagnosis Date   Acid reflux    Allergy    DDD (degenerative disc disease), cervical    spurs in neck area   Degenerative disc disease, cervical    Degenerative lumbar disc    Diarrhea    with rectal spasms    Gastric hypoacidity    Gastritis    GERD (gastroesophageal reflux disease)    IBS (irritable bowel syndrome)    Psoriasis    Seizures (HCC)    mini seizure in right eye with fragrance exposure -  gets dizzy- last 30 secs and gone- MRI in the past - not ever seen a neurologist - last episode 6-9 months ago     Prior to Admission medications   Medication Sig Start Date End Date Taking? Authorizing Provider  OIL OF OREGANO PO Take by mouth. Occ use   Yes [provider]  OVER THE COUNTER MEDICATION Take 1 tablet by mouth 3 (three) times daily with meals. Hydrochloric acid prozyme as needed for digestion   Yes [provider]  OVER THE COUNTER MEDICATION Tumeric curcumin with bioperine 1200mg  daily  Vitamin d3 50 mcg  Vitamin c with calcium and magnesium 500mg   pinellia and magnolia combination herb 600 - 1200mg    Omega 3 2,000 mg  Glucofit 1334 mg   Yes [provider]  Probiotic Product ([employer]S DIGESTIVE PROBIOTIC PO) Take 1 tablet by mouth at bedtime.   Yes [provider]  VITAMIN D-VITAMIN K PO Take by mouth. Takes  this about 3 x a week -- Vitamin D 3 and K2   Yes [provider]  HYDROcodone-acetaminophen (NORCO) 5-325 MG tablet Take 1 tablet by mouth every 6 (six) hours as needed for moderate pain. Patient not taking: Reported on 05/18/2023 03/17/23   Malen Gauze, MD  ibuprofen (ADVIL) 200 MG tablet Take 200 mg by mouth every 6 (six) hours as needed. Patient not taking: Reported on 09/21/2023    [provider]  ondansetron (ZOFRAN) 4 MG tablet Take 1 tablet (4 mg total) by mouth daily as needed for nausea or vomiting. Patient not taking: Reported on 09/21/2023 03/23/23 03/22/24  Malen Gauze, MD  oxyCODONE-acetaminophen (PERCOCET) 5-325 MG tablet Take 1 tablet by mouth every 4 (four) hours as needed for severe pain. Patient not taking: Reported on 05/18/2023 03/23/23 03/22/24  Malen Gauze, MD    Allergies  Allergen Reactions   Gluten Meal    Other     Chemicals, fragrances, gas fumes    Social History   Tobacco Use   Smoking status: Every Day    Current packs/day: 0.50    Types: Cigarettes   Smokeless tobacco: Never  Substance Use Topics   Alcohol use: No    Alcohol/week: 0.0 standard drinks of alcohol  Drug use: No    Family History  Problem Relation Age of Onset   Colon cancer Mother 24   Colon cancer Maternal Grandmother        died 36 from colon cancer    Breast cancer Paternal Aunt    Colon polyps Neg Hx    Esophageal cancer Neg Hx    Rectal cancer Neg Hx    Stomach cancer Neg Hx     Review of Systems  Constitutional: negative for fevers and chills All other systems reviewed and are negative    Constitutional: in no apparent distress  Vitals:   09/21/23 1341  BP: (!) 152/93  Pulse: 74  Resp: 16  Temp: (!) 97.5 F (36.4 C)  SpO2: 94%   EYES: anicteric Respiratory: normal respiratory effort   Labs: Lab Results  Component Value Date   WBC 10.1 04/06/2021   HGB 13.7 04/06/2021   HCT 42.9 04/06/2021   MCV 91.5 04/06/2021    PLT 227 04/06/2021    Lab Results  Component Value Date   CREATININE 0.83 04/06/2021   BUN 19 04/06/2021   NA 137 04/06/2021   K 4.0 04/06/2021   CL 104 04/06/2021   CO2 25 04/06/2021    Lab Results  Component Value Date   ALT 20 08/14/2019   AST 17 08/14/2019   ALKPHOS 106 08/14/2019   BILITOT 0.3 08/14/2019     Assessment: diarrhea, chronic and a handwritten report of Giardia, 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   I am not sure what to do with this information.  Reported to be the vet did a microscopic evaluation and reported this.   Plan: 1)  O and P x 3 Treat accordingly based on laboratory results

## 2023-09-28 ENCOUNTER — Other Ambulatory Visit: Payer: Self-pay

## 2023-09-28 ENCOUNTER — Other Ambulatory Visit: Payer: BC Managed Care – PPO

## 2023-09-28 DIAGNOSIS — R197 Diarrhea, unspecified: Secondary | ICD-10-CM

## 2023-09-30 LAB — OVA AND PARASITE EXAMINATION
CONCENTRATE RESULT:: NONE SEEN
CONCENTRATE RESULT:: NONE SEEN
CONCENTRATE RESULT:: NONE SEEN
MICRO NUMBER:: 15658824
MICRO NUMBER:: 15658811
MICRO NUMBER:: 15658825
SPECIMEN QUALITY:: ADEQUATE
SPECIMEN QUALITY:: ADEQUATE
SPECIMEN QUALITY:: ADEQUATE
TRICHROME RESULT:: NONE SEEN
TRICHROME RESULT:: NONE SEEN
TRICHROME RESULT:: NONE SEEN

## 2023-11-05 ENCOUNTER — Ambulatory Visit (HOSPITAL_COMMUNITY)
Admission: RE | Admit: 2023-11-05 | Discharge: 2023-11-05 | Disposition: A | Discharge status: Home / Self Care | Payer: BC Managed Care – PPO | Source: Ambulatory Visit | Attending: Urology

## 2023-11-05 ENCOUNTER — Encounter: Payer: Self-pay | Admitting: Urology

## 2023-11-05 ENCOUNTER — Ambulatory Visit: Payer: BC Managed Care – PPO | Admitting: Urology

## 2023-11-05 VITALS — BP 133/82 | HR 65

## 2023-11-05 DIAGNOSIS — N200 Calculus of kidney: Secondary | ICD-10-CM | POA: Diagnosis present

## 2023-11-05 DIAGNOSIS — Z87442 Personal history of urinary calculi: Secondary | ICD-10-CM

## 2023-11-05 DIAGNOSIS — Z09 Encounter for follow-up examination after completed treatment for conditions other than malignant neoplasm: Secondary | ICD-10-CM

## 2023-11-05 LAB — URINALYSIS, ROUTINE W REFLEX MICROSCOPIC
Bilirubin, UA: NEGATIVE
Glucose, UA: NEGATIVE
Ketones, UA: NEGATIVE
Leukocytes,UA: NEGATIVE
Nitrite, UA: NEGATIVE
Protein,UA: NEGATIVE
RBC, UA: NEGATIVE
Specific Gravity, UA: 1.02 (ref 1.005–1.030)
Urobilinogen, Ur: 0.2 mg/dL (ref 0.2–1.0)
pH, UA: 7 (ref 5.0–7.5)

## 2023-11-05 NOTE — Patient Instructions (Signed)

## 2023-11-05 NOTE — Progress Notes (Signed)
11/05/2023 8:51 AM   [name] 1961/02/17 [id number]  Referring provider: Babs Sciara, MD [address] [place],  [place] [address]  Followup nephrolithiasis   HPI: Ashlee Carpenter is a 62yo here for followup for nephrolithiasis. She believes she passed a small fragment since her last visit. KUB from today does not show a definitive calculus. She denies any worsening LUTS. She drinks 40oz of water daily.    PMH: Past Medical History:  Diagnosis Date   Acid reflux    Allergy    DDD (degenerative disc disease), cervical    spurs in neck area   Degenerative disc disease, cervical    Degenerative lumbar disc    Diarrhea    with rectal spasms    Gastric hypoacidity    Gastritis    GERD (gastroesophageal reflux disease)    IBS (irritable bowel syndrome)    Psoriasis    Seizures (HCC)    mini seizure in right eye with fragrance exposure -  gets dizzy- last 30 secs and gone- MRI in the past - not ever seen a neurologist - last episode 6-9 months ago     Surgical History: Past Surgical History:  Procedure Laterality Date   CHOLECYSTECTOMY N/A 11/06/2016   Procedure: LAPAROSCOPIC CHOLECYSTECTOMY;  Surgeon: Franky Macho, MD;  Location: [facility];  Service: General;  Laterality: N/A;   COLONOSCOPY     last 2001 Rowan Blase- normal with spastic colon    EXTRACORPOREAL SHOCK WAVE LITHOTRIPSY Right 05/20/2021   Procedure: EXTRACORPOREAL SHOCK WAVE LITHOTRIPSY (ESWL);  Surgeon: Malen Gauze, MD;  Location: [facility];  Service: Urology;  Laterality: Right;   EXTRACORPOREAL SHOCK WAVE LITHOTRIPSY Right 03/23/2023   Procedure: EXTRACORPOREAL SHOCK WAVE LITHOTRIPSY (ESWL);  Surgeon: Malen Gauze, MD;  Location: [facility];  Service: Urology;  Laterality: Right;   TONSILLECTOMY      Home Medications:  Allergies as of 11/05/2023       Reactions   Gluten Meal    Other    Chemicals, fragrances, gas fumes        Medication List        Accurate as of November 05, 2023  8:51 AM. If you have any questions, ask your nurse or doctor.          CVS DIGESTIVE PROBIOTIC PO Take 1 tablet by mouth at bedtime.   HYDROcodone-acetaminophen 5-325 MG tablet Commonly known as: Norco Take 1 tablet by mouth every 6 (six) hours as needed for moderate pain.   ibuprofen 200 MG tablet Commonly known as: ADVIL Take 200 mg by mouth every 6 (six) hours as needed.   OIL OF OREGANO PO Take by mouth. Occ use   ondansetron 4 MG tablet Commonly known as: Zofran Take 1 tablet (4 mg total) by mouth daily as needed for nausea or vomiting.   OVER THE COUNTER MEDICATION Take 1 tablet by mouth 3 (three) times daily with meals. Hydrochloric acid prozyme as needed for digestion   OVER THE COUNTER MEDICATION Tumeric curcumin with bioperine 1200mg  daily  Vitamin d3 50 mcg  Vitamin c with calcium and magnesium 500mg   pinellia and magnolia combination herb 600 - 1200mg    Omega 3 2,000 mg  Glucofit 1334 mg   oxyCODONE-acetaminophen 5-325 MG tablet Commonly known as: Percocet Take 1 tablet by mouth every 4 (four) hours as needed for severe pain.   VITAMIN D-VITAMIN K PO Take by mouth. Takes this about 3 x a week -- Vitamin D  3 and K2        Allergies:  Allergies  Allergen Reactions   Gluten Meal    Other     Chemicals, fragrances, gas fumes    Family History: Family History  Problem Relation Age of Onset   Colon cancer Mother 60   Colon cancer Maternal Grandmother        died 76 from colon cancer    Breast cancer Paternal Aunt    Colon polyps Neg Hx    Esophageal cancer Neg Hx    Rectal cancer Neg Hx    Stomach cancer Neg Hx     Social History:  reports that she has been smoking cigarettes. She has never used smokeless tobacco. She reports that she does not drink alcohol and does not use drugs.  ROS: All other review of systems were reviewed and are negative except what is noted above in HPI  Physical Exam: BP 133/82   Pulse 65    Constitutional:  Alert and oriented, No acute distress. HEENT: [place] AT, moist mucus membranes.  Trachea midline, no masses. Cardiovascular: No clubbing, cyanosis, or edema. Respiratory: Normal respiratory effort, no increased work of breathing. GI: Abdomen is soft, nontender, nondistended, no abdominal masses GU: No CVA tenderness.  Lymph: No cervical or inguinal lymphadenopathy. Skin: No rashes, bruises or suspicious lesions. Neurologic: Grossly intact, no focal deficits, moving all 4 extremities. Psychiatric: Normal mood and affect.  Laboratory Data: Lab Results  Component Value Date   WBC 10.1 04/06/2021   HGB 13.7 04/06/2021   HCT 42.9 04/06/2021   MCV 91.5 04/06/2021   PLT 227 04/06/2021    Lab Results  Component Value Date   CREATININE 0.83 04/06/2021    No results found for: "PSA"  No results found for: "TESTOSTERONE"  No results found for: "HGBA1C"  Urinalysis    Component Value Date/Time   COLORURINE YELLOW 04/06/2021 0002   APPEARANCEUR Clear 08/23/2023 1120   LABSPEC >1.030 (H) 04/06/2021 0002   PHURINE 5.5 04/06/2021 0002   GLUCOSEU Negative 08/23/2023 1120   HGBUR MODERATE (A) 04/06/2021 0002   BILIRUBINUR Negative 08/23/2023 1120   KETONESUR NEGATIVE 04/06/2021 0002   PROTEINUR Negative 08/23/2023 1120   PROTEINUR NEGATIVE 04/06/2021 0002   NITRITE Negative 08/23/2023 1120   NITRITE NEGATIVE 04/06/2021 0002   LEUKOCYTESUR Negative 08/23/2023 1120   LEUKOCYTESUR NEGATIVE 04/06/2021 0002    Lab Results  Component Value Date   LABMICR Comment 08/23/2023   WBCUA 0-5 03/31/2023   LABEPIT 0-10 03/31/2023   MUCUS Present (A) 03/31/2023   BACTERIA None seen 03/31/2023    Pertinent Imaging: KUb today: Images reviewed and discussed with the patient Results for orders placed during the hospital encounter of 05/18/23  Abdomen 1 view (KUB)  Narrative CLINICAL DATA:  Nephrolithiasis.  EXAM: ABDOMEN - 1 VIEW  COMPARISON:  Radiographs  03/31/2023  FINDINGS: The previous calculus projecting over the mid right kidney on 03/31/2023 is not definitively visualized. Nonobstructive bowel-gas pattern. Cholecystectomy. No acute osseous abnormality.  IMPRESSION: No definite nephrolithiasis.   Electronically Signed By: Minerva Fester M.D. On: 05/22/2023 21:27  No results found for this or any previous visit.  No results found for this or any previous visit.  No results found for this or any previous visit.  Results for orders placed during the hospital encounter of 07/12/23  Ultrasound renal complete  Narrative CLINICAL DATA:  Nephrolithiasis.  EXAM: RENAL / URINARY TRACT ULTRASOUND COMPLETE  COMPARISON:  CT renal stone 04/05/2021  FINDINGS: Right  Kidney:  Renal measurements: 8.9 x 4.5 x 5.5 cm = volume: 115.6 mL. Normal renal cortical thickness and echogenicity. Multiple renal stones are demonstrated including a 6 mm stone midpole. No hydronephrosis.  Left Kidney:  Renal measurements: 10.3 x 5.9 x 4.4 cm = volume: 140.1 mL. Echogenicity within normal limits. No mass or hydronephrosis visualized.  Bladder:  Appears normal for degree of bladder distention.  Other:  None.  IMPRESSION: Right nephrolithiasis. No hydronephrosis.   Electronically Signed By: Annia Belt M.D. On: 07/12/2023 16:17  No valid procedures specified. No results found for this or any previous visit.  Results for orders placed during the hospital encounter of 04/05/21  CT Renal Stone Study  Narrative CLINICAL DATA:  Flank pain. Lower back pain. Right side. Dribbling of urine. History of IBS and cholecystectomy.  EXAM: CT ABDOMEN AND PELVIS WITHOUT CONTRAST  TECHNIQUE: Multidetector CT imaging of the abdomen and pelvis was performed following the standard protocol without IV contrast.  COMPARISON:  CT abdomen pelvis 11/05/2016  FINDINGS: Lower chest: No acute abnormality.  Hepatobiliary: No focal liver  abnormality is seen. Status post cholecystectomy. No biliary dilatation.  Pancreas: No focal lesion. Normal pancreatic contour. No surrounding inflammatory changes. No main pancreatic ductal dilatation.  Spleen: Normal in size without focal abnormality.  Adrenals/Urinary Tract:  No adrenal nodule bilaterally.  No left hydronephrosis. Interval development of at least moderate right hydronephrosis as well as at least moderate right hydroureter. Distally there is an associated 7 mm calcified stone at the right ureterovesicular junction. No nephrolithiasis and no contour-deforming renal mass. No ureterolithiasis or hydroureter.  The urinary bladder is decompressed with trace perivesicular fat stranding.  Stomach/Bowel: Stomach is within normal limits. No evidence of bowel wall thickening or dilatation. Fatty infiltration of the colon wall suggestive of chronic inflammatory changes. Scattered colonic diverticulosis. Appendix appears normal.  Vascular/Lymphatic: No abdominal aorta or iliac aneurysm. At least moderate atherosclerotic plaque of the aorta and its branches. No abdominal, pelvic, or inguinal lymphadenopathy.  Reproductive: Uterus and bilateral adnexa are unremarkable.  Other: No intraperitoneal free fluid. No intraperitoneal free gas. No organized fluid collection.  Musculoskeletal:  No abdominal wall hernia or abnormality.  No suspicious lytic or blastic osseous lesions. No acute displaced fracture. Multilevel degenerative changes of the spine.  IMPRESSION: 1. Obstructive right 7 mm ureterovesicular junction stone. Given trace perivesicular fat stranding, recommend correlation with urinalysis for superimposed infection. 2. Scattered colonic diverticulosis with no acute diverticulitis. 3.  Aortic Atherosclerosis (ICD10-I70.0).   Electronically Signed By: Tish Frederickson M.D. On: 04/06/2021 00:30   Assessment & Plan:    1. Nephrolithiasis Dietary handout  given Followup 6 months KUB - Urinalysis, Routine w reflex microscopic   No follow-ups on file.  Wilkie Aye, MD  [facility] [place]

## 2024-05-08 ENCOUNTER — Encounter: Payer: Self-pay | Admitting: Urology

## 2024-05-08 ENCOUNTER — Ambulatory Visit (HOSPITAL_COMMUNITY)
Admission: RE | Admit: 2024-05-08 | Discharge: 2024-05-08 | Disposition: A | Discharge status: Home / Self Care | Source: Ambulatory Visit | Attending: Urology | Admitting: Urology

## 2024-05-08 ENCOUNTER — Ambulatory Visit: Payer: BC Managed Care – PPO | Admitting: Urology

## 2024-05-08 VITALS — BP 156/78 | HR 69

## 2024-05-08 DIAGNOSIS — N200 Calculus of kidney: Secondary | ICD-10-CM

## 2024-05-08 LAB — URINALYSIS, ROUTINE W REFLEX MICROSCOPIC
Bilirubin, UA: NEGATIVE
Glucose, UA: NEGATIVE
Ketones, UA: NEGATIVE
Leukocytes,UA: NEGATIVE
Nitrite, UA: NEGATIVE
Protein,UA: NEGATIVE
RBC, UA: NEGATIVE
Specific Gravity, UA: 1.02 (ref 1.005–1.030)
Urobilinogen, Ur: 0.2 mg/dL (ref 0.2–1.0)
pH, UA: 6 (ref 5.0–7.5)

## 2024-05-08 NOTE — Patient Instructions (Signed)

## 2024-05-08 NOTE — Progress Notes (Signed)
 05/08/2024 9:25 AM   Zannie Hey 1961/02/14 [id number]  Referring provider: Bennet Brasil, MD [address] [place],  [place] [address]  Followup nephrolithiasis  HPI: Ms Brummet is a 62yo here for followup fro nephrolithiasis. No stone events since last visit. She denies any flank pain. KUb from today shows no definitive calculi. She drinks 40-60oz daily. No soda intake. She drinks decaf black tea 12-20 oz daily    PMH: Past Medical History:  Diagnosis Date   Acid reflux    Allergy    DDD (degenerative disc disease), cervical    spurs in neck area   Degenerative disc disease, cervical    Degenerative lumbar disc    Diarrhea    with rectal spasms    Gastric hypoacidity    Gastritis    GERD (gastroesophageal reflux disease)    IBS (irritable bowel syndrome)    Psoriasis    Seizures (HCC)    mini seizure in right eye with fragrance exposure -  gets dizzy- last 30 secs and gone- MRI in the past - not ever seen a neurologist - last episode 6-9 months ago     Surgical History: Past Surgical History:  Procedure Laterality Date   CHOLECYSTECTOMY N/A 11/06/2016   Procedure: LAPAROSCOPIC CHOLECYSTECTOMY;  Surgeon: Alanda Allegra, MD;  Location: [facility];  Service: General;  Laterality: N/A;   COLONOSCOPY     last 2001 Tonie Franks- normal with spastic colon    EXTRACORPOREAL SHOCK WAVE LITHOTRIPSY Right 05/20/2021   Procedure: EXTRACORPOREAL SHOCK WAVE LITHOTRIPSY (ESWL);  Surgeon: Marco Severs, MD;  Location: [facility];  Service: Urology;  Laterality: Right;   EXTRACORPOREAL SHOCK WAVE LITHOTRIPSY Right 03/23/2023   Procedure: EXTRACORPOREAL SHOCK WAVE LITHOTRIPSY (ESWL);  Surgeon: Marco Severs, MD;  Location: [facility];  Service: Urology;  Laterality: Right;   TONSILLECTOMY      Home Medications:  Allergies as of 05/08/2024       Reactions   Gluten Meal    Other    Chemicals, fragrances, gas fumes        Medication List        Accurate as of  May 08, 2024  9:25 AM. If you have any questions, ask your nurse or doctor.          [employer] DIGESTIVE PROBIOTIC PO Take 1 tablet by mouth at bedtime.   HYDROcodone -acetaminophen  5-325 MG tablet Commonly known as: Norco Take 1 tablet by mouth every 6 (six) hours as needed for moderate pain.   ibuprofen 200 MG tablet Commonly known as: ADVIL Take 200 mg by mouth every 6 (six) hours as needed.   OIL OF [place] PO Take by mouth. Occ use   OVER THE COUNTER MEDICATION Take 1 tablet by mouth 3 (three) times daily with meals. Hydrochloric acid prozyme as needed for digestion   OVER THE COUNTER MEDICATION Tumeric curcumin with bioperine 1200mg  daily  Vitamin d3 50 mcg  Vitamin c with calcium and magnesium 500mg   pinellia and magnolia combination herb 600 - 1200mg    Omega 3 2,000 mg  Glucofit 1334 mg   VITAMIN D -VITAMIN K PO Take by mouth. Takes this about 3 x a week -- Vitamin D  3 and K2        Allergies:  Allergies  Allergen Reactions   Gluten Meal    Other     Chemicals, fragrances, gas fumes    Family History: Family History  Problem Relation Age of Onset   Colon  cancer Mother 19   Colon cancer Maternal Grandmother        died 17 from colon cancer    Breast cancer Paternal Aunt    Colon polyps Neg Hx    Esophageal cancer Neg Hx    Rectal cancer Neg Hx    Stomach cancer Neg Hx     Social History:  reports that she has been smoking cigarettes. She has never used smokeless tobacco. She reports that she does not drink alcohol and does not use drugs.  ROS: All other review of systems were reviewed and are negative except what is noted above in HPI  Physical Exam: BP (!) 156/78   Pulse 69   Constitutional:  Alert and oriented, No acute distress. HEENT: [place] AT, moist mucus membranes.  Trachea midline, no masses. Cardiovascular: No clubbing, cyanosis, or edema. Respiratory: Normal respiratory effort, no increased work of breathing. GI: Abdomen is soft,  nontender, nondistended, no abdominal masses GU: No CVA tenderness.  Lymph: No cervical or inguinal lymphadenopathy. Skin: No rashes, bruises or suspicious lesions. Neurologic: Grossly intact, no focal deficits, moving all 4 extremities. Psychiatric: Normal mood and affect.  Laboratory Data: Lab Results  Component Value Date   WBC 10.1 04/06/2021   HGB 13.7 04/06/2021   HCT 42.9 04/06/2021   MCV 91.5 04/06/2021   PLT 227 04/06/2021    Lab Results  Component Value Date   CREATININE 0.83 04/06/2021    No results found for: "PSA"  No results found for: "TESTOSTERONE"  No results found for: "HGBA1C"  Urinalysis    Component Value Date/Time   COLORURINE YELLOW 04/06/2021 0002   APPEARANCEUR Clear 11/05/2023 0911   LABSPEC >1.030 (H) 04/06/2021 0002   PHURINE 5.5 04/06/2021 0002   GLUCOSEU Negative 11/05/2023 0911   HGBUR MODERATE (A) 04/06/2021 0002   BILIRUBINUR Negative 11/05/2023 0911   KETONESUR NEGATIVE 04/06/2021 0002   PROTEINUR Negative 11/05/2023 0911   PROTEINUR NEGATIVE 04/06/2021 0002   NITRITE Negative 11/05/2023 0911   NITRITE NEGATIVE 04/06/2021 0002   LEUKOCYTESUR Negative 11/05/2023 0911   LEUKOCYTESUR NEGATIVE 04/06/2021 0002    Lab Results  Component Value Date   LABMICR Comment 11/05/2023   WBCUA 0-5 03/31/2023   LABEPIT 0-10 03/31/2023   MUCUS Present (A) 03/31/2023   BACTERIA None seen 03/31/2023    Pertinent Imaging: KUb today: Images reviewed and discussed with the patient  Results for orders placed during the hospital encounter of 11/05/23  Abdomen 1 view (KUB)  Narrative CLINICAL DATA:  nephrolithiasis  EXAM: ABDOMEN - 1 VIEW  COMPARISON:  May 18, 2023  FINDINGS: The bowel gas pattern is normal. No definitive radio-opaque calculi or other significant radiographic abnormality are seen. Previously described RIGHT-sided nephrolithiasis is not discretely visualized radiographically. Status post cholecystectomy.  Atherosclerotic calcifications.  IMPRESSION: Previously described RIGHT-sided nephrolithiasis is not discretely visualized radiographically.   Electronically Signed By: Clancy Crimes M.D. On: 11/15/2023 10:32  No results found for this or any previous visit.  No results found for this or any previous visit.  No results found for this or any previous visit.  Results for orders placed during the hospital encounter of 07/12/23  Ultrasound renal complete  Narrative CLINICAL DATA:  Nephrolithiasis.  EXAM: RENAL / URINARY TRACT ULTRASOUND COMPLETE  COMPARISON:  CT renal stone 04/05/2021  FINDINGS: Right Kidney:  Renal measurements: 8.9 x 4.5 x 5.5 cm = volume: 115.6 mL. Normal renal cortical thickness and echogenicity. Multiple renal stones are demonstrated including a 6 mm stone midpole.  No hydronephrosis.  Left Kidney:  Renal measurements: 10.3 x 5.9 x 4.4 cm = volume: 140.1 mL. Echogenicity within normal limits. No mass or hydronephrosis visualized.  Bladder:  Appears normal for degree of bladder distention.  Other:  None.  IMPRESSION: Right nephrolithiasis. No hydronephrosis.   Electronically Signed By: Jone Neither M.D. On: 07/12/2023 16:17  No results found for this or any previous visit.  No results found for this or any previous visit.  Results for orders placed during the hospital encounter of 04/05/21  CT Renal Stone Study  Narrative CLINICAL DATA:  Flank pain. Lower back pain. Right side. Dribbling of urine. History of IBS and cholecystectomy.  EXAM: CT ABDOMEN AND PELVIS WITHOUT CONTRAST  TECHNIQUE: Multidetector CT imaging of the abdomen and pelvis was performed following the standard protocol without IV contrast.  COMPARISON:  CT abdomen pelvis 11/05/2016  FINDINGS: Lower chest: No acute abnormality.  Hepatobiliary: No focal liver abnormality is seen. Status post cholecystectomy. No biliary dilatation.  Pancreas: No  focal lesion. Normal pancreatic contour. No surrounding inflammatory changes. No main pancreatic ductal dilatation.  Spleen: Normal in size without focal abnormality.  Adrenals/Urinary Tract:  No adrenal nodule bilaterally.  No left hydronephrosis. Interval development of at least moderate right hydronephrosis as well as at least moderate right hydroureter. Distally there is an associated 7 mm calcified stone at the right ureterovesicular junction. No nephrolithiasis and no contour-deforming renal mass. No ureterolithiasis or hydroureter.  The urinary bladder is decompressed with trace perivesicular fat stranding.  Stomach/Bowel: Stomach is within normal limits. No evidence of bowel wall thickening or dilatation. Fatty infiltration of the colon wall suggestive of chronic inflammatory changes. Scattered colonic diverticulosis. Appendix appears normal.  Vascular/Lymphatic: No abdominal aorta or iliac aneurysm. At least moderate atherosclerotic plaque of the aorta and its branches. No abdominal, pelvic, or inguinal lymphadenopathy.  Reproductive: Uterus and bilateral adnexa are unremarkable.  Other: No intraperitoneal free fluid. No intraperitoneal free gas. No organized fluid collection.  Musculoskeletal:  No abdominal wall hernia or abnormality.  No suspicious lytic or blastic osseous lesions. No acute displaced fracture. Multilevel degenerative changes of the spine.  IMPRESSION: 1. Obstructive right 7 mm ureterovesicular junction stone. Given trace perivesicular fat stranding, recommend correlation with urinalysis for superimposed infection. 2. Scattered colonic diverticulosis with no acute diverticulitis. 3.  Aortic Atherosclerosis (ICD10-I70.0).   Electronically Signed By: Morgane  Naveau M.D. On: 04/06/2021 00:30   Assessment & Plan:    1. Nephrolithiasis (Primary) Followup 1 year with KUB - Urinalysis, Routine w reflex microscopic   No follow-ups on  file.  Johnie Nailer, MD  [facility] [place]

## 2025-05-07 ENCOUNTER — Ambulatory Visit: Admitting: Urology
